# Patient Record
Sex: Female | Born: 1945 | Race: White | Hispanic: No | Marital: Married
Health system: Southern US, Community
[De-identification: ages and names within clinical notes are randomized; demographics above are authoritative.]

## PROBLEM LIST (undated history)

## (undated) DIAGNOSIS — Z8619 Personal history of other infectious and parasitic diseases: Secondary | ICD-10-CM

## (undated) DIAGNOSIS — F329 Major depressive disorder, single episode, unspecified: Secondary | ICD-10-CM

## (undated) DIAGNOSIS — J449 Chronic obstructive pulmonary disease, unspecified: Secondary | ICD-10-CM

## (undated) DIAGNOSIS — T7840XA Allergy, unspecified, initial encounter: Secondary | ICD-10-CM

## (undated) DIAGNOSIS — E039 Hypothyroidism, unspecified: Secondary | ICD-10-CM

## (undated) DIAGNOSIS — Z860100 Personal history of colon polyps, unspecified: Secondary | ICD-10-CM

## (undated) DIAGNOSIS — Z8719 Personal history of other diseases of the digestive system: Secondary | ICD-10-CM

## (undated) DIAGNOSIS — F32A Depression, unspecified: Secondary | ICD-10-CM

## (undated) DIAGNOSIS — M549 Dorsalgia, unspecified: Secondary | ICD-10-CM

## (undated) DIAGNOSIS — E669 Obesity, unspecified: Secondary | ICD-10-CM

## (undated) HISTORY — DX: Personal history of other diseases of the digestive system: Z87.19

## (undated) HISTORY — DX: Personal history of other infectious and parasitic diseases: Z86.19

## (undated) HISTORY — PX: TONSILLECTOMY: SUR1361

## (undated) HISTORY — PX: FOOT SURGERY: SHX648

## (undated) HISTORY — PX: NASAL SEPTUM SURGERY: SHX37

## (undated) HISTORY — DX: Depression, unspecified: F32.A

## (undated) HISTORY — DX: Dorsalgia, unspecified: M54.9

## (undated) HISTORY — DX: Personal history of colon polyps, unspecified: Z86.0100

## (undated) HISTORY — DX: Chronic obstructive pulmonary disease, unspecified: J44.9

## (undated) HISTORY — DX: Personal history of colonic polyps: Z86.010

## (undated) HISTORY — DX: Hypothyroidism, unspecified: E03.9

## (undated) HISTORY — DX: Major depressive disorder, single episode, unspecified: F32.9

## (undated) HISTORY — DX: Allergy, unspecified, initial encounter: T78.40XA

## (undated) HISTORY — DX: Obesity, unspecified: E66.9

## (undated) HISTORY — PX: OTHER SURGICAL HISTORY: SHX169

---

## 1983-05-21 HISTORY — PX: CARDIOVASCULAR STRESS TEST: SHX262

## 1988-05-20 HISTORY — PX: CHOLECYSTECTOMY: SHX55

## 1988-05-20 HISTORY — PX: TOTAL ABDOMINAL HYSTERECTOMY: SHX209

## 1988-05-20 HISTORY — PX: OTHER SURGICAL HISTORY: SHX169

## 2000-05-20 HISTORY — PX: BACK SURGERY: SHX140

## 2003-05-21 HISTORY — PX: GASTRIC BYPASS: SHX52

## 2003-05-21 HISTORY — PX: CT HEAD LIMITED W/CM: HXRAD128

## 2005-12-17 ENCOUNTER — Emergency Department (HOSPITAL_COMMUNITY): Admission: EM | Admit: 2005-12-17 | Discharge: 2005-12-17 | Payer: Self-pay | Admitting: Emergency Medicine

## 2006-01-07 ENCOUNTER — Ambulatory Visit: Payer: Self-pay | Admitting: Family Medicine

## 2006-02-05 ENCOUNTER — Ambulatory Visit: Payer: Self-pay | Admitting: Family Medicine

## 2006-02-18 ENCOUNTER — Encounter: Admission: RE | Admit: 2006-02-18 | Discharge: 2006-02-18 | Payer: Self-pay | Admitting: Family Medicine

## 2006-04-02 ENCOUNTER — Ambulatory Visit: Payer: Self-pay | Admitting: Family Medicine

## 2006-05-06 ENCOUNTER — Ambulatory Visit: Payer: Self-pay | Admitting: Family Medicine

## 2006-06-03 ENCOUNTER — Ambulatory Visit: Payer: Self-pay | Admitting: Family Medicine

## 2006-06-03 LAB — CONVERTED CEMR LAB
Rhuematoid fact SerPl-aCnc: <20 intl units/mL — ABNORMAL LOW (ref 0.0–20.0)
Sed Rate: 10 mm/hr (ref 0–25)

## 2006-08-18 ENCOUNTER — Encounter: Payer: Self-pay | Admitting: Family Medicine

## 2006-08-18 DIAGNOSIS — J309 Allergic rhinitis, unspecified: Secondary | ICD-10-CM | POA: Insufficient documentation

## 2006-08-18 DIAGNOSIS — F410 Panic disorder [episodic paroxysmal anxiety] without agoraphobia: Secondary | ICD-10-CM

## 2006-08-18 DIAGNOSIS — F329 Major depressive disorder, single episode, unspecified: Secondary | ICD-10-CM

## 2006-08-18 DIAGNOSIS — E039 Hypothyroidism, unspecified: Secondary | ICD-10-CM | POA: Insufficient documentation

## 2006-12-02 ENCOUNTER — Encounter: Payer: Self-pay | Admitting: *Deleted

## 2006-12-03 ENCOUNTER — Ambulatory Visit: Payer: Self-pay | Admitting: Family Medicine

## 2006-12-03 DIAGNOSIS — F411 Generalized anxiety disorder: Secondary | ICD-10-CM | POA: Insufficient documentation

## 2006-12-15 ENCOUNTER — Telehealth (INDEPENDENT_AMBULATORY_CARE_PROVIDER_SITE_OTHER): Payer: Self-pay | Admitting: *Deleted

## 2006-12-16 ENCOUNTER — Telehealth (INDEPENDENT_AMBULATORY_CARE_PROVIDER_SITE_OTHER): Payer: Self-pay | Admitting: *Deleted

## 2007-01-13 ENCOUNTER — Ambulatory Visit: Payer: Self-pay | Admitting: Internal Medicine

## 2007-02-12 ENCOUNTER — Ambulatory Visit: Payer: Self-pay | Admitting: Family Medicine

## 2007-02-16 ENCOUNTER — Telehealth (INDEPENDENT_AMBULATORY_CARE_PROVIDER_SITE_OTHER): Payer: Self-pay | Admitting: Internal Medicine

## 2007-03-09 ENCOUNTER — Telehealth (INDEPENDENT_AMBULATORY_CARE_PROVIDER_SITE_OTHER): Payer: Self-pay | Admitting: *Deleted

## 2007-03-31 ENCOUNTER — Telehealth (INDEPENDENT_AMBULATORY_CARE_PROVIDER_SITE_OTHER): Payer: Self-pay | Admitting: *Deleted

## 2007-05-29 ENCOUNTER — Telehealth: Payer: Self-pay | Admitting: Family Medicine

## 2007-06-11 ENCOUNTER — Telehealth: Payer: Self-pay | Admitting: Family Medicine

## 2007-07-02 ENCOUNTER — Ambulatory Visit: Payer: Self-pay | Admitting: Family Medicine

## 2007-07-03 ENCOUNTER — Telehealth (INDEPENDENT_AMBULATORY_CARE_PROVIDER_SITE_OTHER): Payer: Self-pay | Admitting: Internal Medicine

## 2007-07-03 LAB — CONVERTED CEMR LAB
Basophils Absolute: 0.1 10*3/uL (ref 0.0–0.1)
Eosinophils Absolute: 0.2 10*3/uL (ref 0.0–0.6)
MCHC: 33 g/dL (ref 30.0–36.0)
MCV: 94.3 fL (ref 78.0–100.0)
Monocytes Relative: 6.3 % (ref 3.0–11.0)
Platelets: 189 10*3/uL (ref 150–400)
RBC: 4.03 M/uL (ref 3.87–5.11)
RDW: 12.5 % (ref 11.5–14.6)

## 2007-07-07 ENCOUNTER — Telehealth (INDEPENDENT_AMBULATORY_CARE_PROVIDER_SITE_OTHER): Payer: Self-pay | Admitting: Internal Medicine

## 2007-07-08 ENCOUNTER — Ambulatory Visit: Payer: Self-pay | Admitting: Family Medicine

## 2007-07-14 ENCOUNTER — Telehealth (INDEPENDENT_AMBULATORY_CARE_PROVIDER_SITE_OTHER): Payer: Self-pay | Admitting: Internal Medicine

## 2007-11-16 ENCOUNTER — Ambulatory Visit: Payer: Self-pay | Admitting: Family Medicine

## 2007-11-25 ENCOUNTER — Telehealth: Payer: Self-pay | Admitting: Family Medicine

## 2007-11-26 ENCOUNTER — Ambulatory Visit: Payer: Self-pay | Admitting: Family Medicine

## 2008-01-21 ENCOUNTER — Telehealth: Payer: Self-pay | Admitting: Family Medicine

## 2008-02-03 ENCOUNTER — Ambulatory Visit: Payer: Self-pay | Admitting: Family Medicine

## 2008-02-04 ENCOUNTER — Ambulatory Visit: Payer: Self-pay | Admitting: Family Medicine

## 2008-02-05 LAB — CONVERTED CEMR LAB
Albumin: 3.7 g/dL (ref 3.5–5.2)
BUN: 20 mg/dL (ref 6–23)
Basophils Absolute: 0.1 10*3/uL (ref 0.0–0.1)
Basophils Relative: 1 % (ref 0.0–3.0)
Calcium: 9 mg/dL (ref 8.4–10.5)
Cholesterol: 252 mg/dL (ref 0–200)
Eosinophils Absolute: 0.4 10*3/uL (ref 0.0–0.7)
GFR calc Af Amer: 93 mL/min
GFR calc non Af Amer: 77 mL/min
Glucose, Bld: 92 mg/dL (ref 70–99)
HDL: 94.5 mg/dL (ref >39.0)
Lymphocytes Relative: 50.9 % — ABNORMAL HIGH (ref 12.0–46.0)
MCHC: 33.2 g/dL (ref 30.0–36.0)
MCV: 95 fL (ref 78.0–100.0)
Neutro Abs: 2.5 10*3/uL (ref 1.4–7.7)
Neutrophils Relative %: 35.6 % — ABNORMAL LOW (ref 43.0–77.0)
Potassium: 4.5 meq/L (ref 3.5–5.1)
RBC: 4.15 M/uL (ref 3.87–5.11)
RDW: 13.8 % (ref 11.5–14.6)
Sodium: 140 meq/L (ref 135–145)
TSH: 22.33 microintl units/mL — ABNORMAL HIGH (ref 0.35–5.50)
Total Protein: 6.9 g/dL (ref 6.0–8.3)
VLDL: 16 mg/dL (ref 0–40)
Vitamin B-12: 177 pg/mL — ABNORMAL LOW (ref 211–911)

## 2008-02-10 ENCOUNTER — Telehealth: Payer: Self-pay | Admitting: Family Medicine

## 2008-02-15 ENCOUNTER — Ambulatory Visit: Payer: Self-pay | Admitting: Family Medicine

## 2008-02-22 ENCOUNTER — Telehealth (INDEPENDENT_AMBULATORY_CARE_PROVIDER_SITE_OTHER): Payer: Self-pay | Admitting: *Deleted

## 2008-03-16 ENCOUNTER — Ambulatory Visit: Payer: Self-pay | Admitting: Family Medicine

## 2008-03-17 ENCOUNTER — Ambulatory Visit: Payer: Self-pay | Admitting: Family Medicine

## 2008-03-17 LAB — CONVERTED CEMR LAB
TSH: 1.1 microintl units/mL (ref 0.35–5.50)
Vitamin B-12: 349 pg/mL (ref 211–911)

## 2008-04-01 ENCOUNTER — Ambulatory Visit: Payer: Self-pay | Admitting: Family Medicine

## 2008-04-13 ENCOUNTER — Telehealth (INDEPENDENT_AMBULATORY_CARE_PROVIDER_SITE_OTHER): Payer: Self-pay | Admitting: *Deleted

## 2008-05-25 ENCOUNTER — Ambulatory Visit: Payer: Self-pay | Admitting: Family Medicine

## 2008-05-28 ENCOUNTER — Encounter: Admission: RE | Admit: 2008-05-28 | Discharge: 2008-05-28 | Payer: Self-pay | Admitting: Family Medicine

## 2008-06-01 ENCOUNTER — Ambulatory Visit: Payer: Self-pay | Admitting: Family Medicine

## 2008-06-01 LAB — CONVERTED CEMR LAB
BUN: 20 mg/dL (ref 6–23)
Calcium: 8.9 mg/dL (ref 8.4–10.5)
Creatinine, Ser: 0.7 mg/dL (ref 0.4–1.2)
GFR calc Af Amer: 109 mL/min
GFR calc non Af Amer: 90 mL/min

## 2008-06-02 ENCOUNTER — Ambulatory Visit: Payer: Self-pay | Admitting: Cardiology

## 2008-06-08 ENCOUNTER — Encounter: Payer: Self-pay | Admitting: Family Medicine

## 2008-06-23 ENCOUNTER — Ambulatory Visit (HOSPITAL_COMMUNITY): Admission: RE | Admit: 2008-06-23 | Discharge: 2008-06-23 | Payer: Self-pay | Admitting: Neurosurgery

## 2008-07-06 ENCOUNTER — Encounter: Payer: Self-pay | Admitting: Family Medicine

## 2008-07-12 ENCOUNTER — Telehealth: Payer: Self-pay | Admitting: Family Medicine

## 2008-07-12 ENCOUNTER — Encounter: Payer: Self-pay | Admitting: Family Medicine

## 2008-11-04 ENCOUNTER — Encounter: Payer: Self-pay | Admitting: Family Medicine

## 2009-02-10 ENCOUNTER — Ambulatory Visit: Payer: Self-pay | Admitting: Family Medicine

## 2009-02-10 DIAGNOSIS — D692 Other nonthrombocytopenic purpura: Secondary | ICD-10-CM | POA: Insufficient documentation

## 2009-02-13 ENCOUNTER — Telehealth: Payer: Self-pay | Admitting: Family Medicine

## 2009-02-13 ENCOUNTER — Ambulatory Visit: Payer: Self-pay | Admitting: Family Medicine

## 2009-02-17 LAB — CONVERTED CEMR LAB
ALT: 17 units/L (ref 0–35)
BUN: 16 mg/dL (ref 6–23)
CO2: 31 meq/L (ref 19–32)
Chloride: 105 meq/L (ref 96–112)
Cholesterol: 224 mg/dL — ABNORMAL HIGH (ref 0–200)
Direct LDL: 147.4 mg/dL
Eosinophils Absolute: 0.2 10*3/uL (ref 0.0–0.7)
Eosinophils Relative: 2.5 % (ref 0.0–5.0)
Glucose, Bld: 94 mg/dL (ref 70–99)
HCT: 41.4 % (ref 36.0–46.0)
Lymphs Abs: 3.4 10*3/uL (ref 0.7–4.0)
MCHC: 33.3 g/dL (ref 30.0–36.0)
MCV: 97.2 fL (ref 78.0–100.0)
Monocytes Absolute: 0.5 10*3/uL (ref 0.1–1.0)
Platelets: 202 10*3/uL (ref 150.0–400.0)
Potassium: 4.6 meq/L (ref 3.5–5.1)
Prothrombin Time: 9.2 s (ref 9.1–11.7)
TSH: 7.84 microintl units/mL — ABNORMAL HIGH (ref 0.35–5.50)
Total Bilirubin: 1 mg/dL (ref 0.3–1.2)
Total Protein: 6.6 g/dL (ref 6.0–8.3)
WBC: 7.3 10*3/uL (ref 4.5–10.5)

## 2009-03-22 ENCOUNTER — Ambulatory Visit: Payer: Self-pay | Admitting: Family Medicine

## 2009-05-02 ENCOUNTER — Ambulatory Visit: Payer: Self-pay | Admitting: Family Medicine

## 2009-05-02 DIAGNOSIS — E7800 Pure hypercholesterolemia, unspecified: Secondary | ICD-10-CM

## 2009-05-02 DIAGNOSIS — M949 Disorder of cartilage, unspecified: Secondary | ICD-10-CM

## 2009-05-02 DIAGNOSIS — M899 Disorder of bone, unspecified: Secondary | ICD-10-CM | POA: Insufficient documentation

## 2009-05-02 LAB — HM SIGMOIDOSCOPY

## 2009-05-24 ENCOUNTER — Encounter: Admission: RE | Admit: 2009-05-24 | Discharge: 2009-05-24 | Payer: Self-pay | Admitting: Family Medicine

## 2009-05-29 ENCOUNTER — Encounter: Admission: RE | Admit: 2009-05-29 | Discharge: 2009-05-29 | Payer: Self-pay | Admitting: Family Medicine

## 2009-05-29 ENCOUNTER — Encounter (INDEPENDENT_AMBULATORY_CARE_PROVIDER_SITE_OTHER): Payer: Self-pay | Admitting: *Deleted

## 2009-05-31 ENCOUNTER — Encounter (INDEPENDENT_AMBULATORY_CARE_PROVIDER_SITE_OTHER): Payer: Self-pay | Admitting: *Deleted

## 2009-06-14 ENCOUNTER — Ambulatory Visit: Payer: Self-pay | Admitting: Family Medicine

## 2009-06-21 LAB — CONVERTED CEMR LAB
Alkaline Phosphatase: 85 units/L (ref 39–117)
Basophils Relative: 0.5 % (ref 0.0–3.0)
Bilirubin, Direct: 0.1 mg/dL (ref 0.0–0.3)
Calcium: 8.8 mg/dL (ref 8.4–10.5)
Eosinophils Absolute: 0.3 10*3/uL (ref 0.0–0.7)
GFR calc non Af Amer: 89.41 mL/min (ref >60)
HDL: 70.1 mg/dL (ref >39.00)
Hemoglobin: 12.8 g/dL (ref 12.0–15.0)
MCHC: 33 g/dL (ref 30.0–36.0)
MCV: 93.6 fL (ref 78.0–100.0)
Monocytes Absolute: 0.5 10*3/uL (ref 0.1–1.0)
Neutro Abs: 2.4 10*3/uL (ref 1.4–7.7)
RBC: 4.15 M/uL (ref 3.87–5.11)
Sodium: 142 meq/L (ref 135–145)
Total CHOL/HDL Ratio: 3
VLDL: 18.2 mg/dL (ref 0.0–40.0)

## 2009-06-22 ENCOUNTER — Ambulatory Visit: Payer: Self-pay | Admitting: Family Medicine

## 2009-06-30 ENCOUNTER — Ambulatory Visit: Payer: Self-pay | Admitting: Internal Medicine

## 2009-08-10 ENCOUNTER — Ambulatory Visit: Payer: Self-pay | Admitting: Family Medicine

## 2009-12-05 ENCOUNTER — Ambulatory Visit: Payer: Self-pay | Admitting: Family Medicine

## 2009-12-12 ENCOUNTER — Emergency Department (HOSPITAL_COMMUNITY): Admission: EM | Admit: 2009-12-12 | Discharge: 2009-12-13 | Payer: Self-pay | Admitting: Emergency Medicine

## 2009-12-14 ENCOUNTER — Encounter: Payer: Self-pay | Admitting: Family Medicine

## 2010-01-09 ENCOUNTER — Telehealth: Payer: Self-pay | Admitting: Family Medicine

## 2010-01-10 ENCOUNTER — Ambulatory Visit: Payer: Self-pay | Admitting: Family Medicine

## 2010-01-23 ENCOUNTER — Telehealth (INDEPENDENT_AMBULATORY_CARE_PROVIDER_SITE_OTHER): Payer: Self-pay | Admitting: *Deleted

## 2010-01-25 ENCOUNTER — Ambulatory Visit: Payer: Self-pay | Admitting: Family Medicine

## 2010-01-25 DIAGNOSIS — I10 Essential (primary) hypertension: Secondary | ICD-10-CM | POA: Insufficient documentation

## 2010-01-31 ENCOUNTER — Encounter (INDEPENDENT_AMBULATORY_CARE_PROVIDER_SITE_OTHER): Payer: Self-pay | Admitting: *Deleted

## 2010-01-31 ENCOUNTER — Ambulatory Visit: Payer: Self-pay | Admitting: Family Medicine

## 2010-01-31 LAB — CONVERTED CEMR LAB
AST: 20 units/L (ref 0–37)
Albumin: 3.7 g/dL (ref 3.5–5.2)
BUN: 22 mg/dL (ref 6–23)
Cholesterol: 188 mg/dL (ref 0–200)
GFR calc non Af Amer: 82.41 mL/min (ref >60)
Glucose, Bld: 94 mg/dL (ref 70–99)
Potassium: 4.4 meq/L (ref 3.5–5.1)
VLDL: 17.6 mg/dL (ref 0.0–40.0)

## 2010-02-16 ENCOUNTER — Ambulatory Visit: Payer: Self-pay | Admitting: Family Medicine

## 2010-03-02 ENCOUNTER — Ambulatory Visit: Payer: Self-pay | Admitting: Family Medicine

## 2010-03-15 ENCOUNTER — Ambulatory Visit: Payer: Self-pay | Admitting: Family Medicine

## 2010-04-03 ENCOUNTER — Ambulatory Visit: Payer: Self-pay | Admitting: Family Medicine

## 2010-04-24 ENCOUNTER — Ambulatory Visit: Payer: Self-pay | Admitting: Family Medicine

## 2010-05-23 ENCOUNTER — Telehealth: Payer: Self-pay | Admitting: Family Medicine

## 2010-05-23 ENCOUNTER — Ambulatory Visit
Admission: RE | Admit: 2010-05-23 | Discharge: 2010-05-23 | Payer: Self-pay | Source: Home / Self Care | Attending: Family Medicine | Admitting: Family Medicine

## 2010-05-28 ENCOUNTER — Telehealth (INDEPENDENT_AMBULATORY_CARE_PROVIDER_SITE_OTHER): Payer: Self-pay | Admitting: *Deleted

## 2010-05-29 ENCOUNTER — Ambulatory Visit: Admit: 2010-05-29 | Payer: Self-pay | Admitting: Family Medicine

## 2010-06-05 ENCOUNTER — Ambulatory Visit
Admission: RE | Admit: 2010-06-05 | Discharge: 2010-06-05 | Payer: Self-pay | Source: Home / Self Care | Attending: Family Medicine | Admitting: Family Medicine

## 2010-06-05 ENCOUNTER — Encounter: Payer: Self-pay | Admitting: Family Medicine

## 2010-06-05 DIAGNOSIS — R413 Other amnesia: Secondary | ICD-10-CM | POA: Insufficient documentation

## 2010-06-05 LAB — CONVERTED CEMR LAB
Cholesterol, target level: 200 mg/dL
HDL goal, serum: 40 mg/dL
LDL Goal: 160 mg/dL

## 2010-06-12 ENCOUNTER — Encounter
Admission: RE | Admit: 2010-06-12 | Discharge: 2010-06-12 | Payer: Self-pay | Source: Home / Self Care | Attending: Family Medicine | Admitting: Family Medicine

## 2010-06-12 LAB — HM MAMMOGRAPHY: HM Mammogram: NEGATIVE

## 2010-06-19 ENCOUNTER — Ambulatory Visit
Admission: RE | Admit: 2010-06-19 | Discharge: 2010-06-19 | Payer: Self-pay | Source: Home / Self Care | Attending: Family Medicine | Admitting: Family Medicine

## 2010-06-19 NOTE — Assessment & Plan Note (Signed)
Summary: URI   Vital Signs:  Patient profile:   65 year old female Weight:      208 pounds Temp:     98.6 degrees F oral Pulse rate:   80 / minute Pulse rhythm:   regular Resp:     16 per minute BP sitting:   112 / 60  (left arm) Cuff size:   large  Vitals Entered By: Mervin Hack CMA Duncan Dull) (June 30, 2009 4:06 PM) CC: cold   History of Present Illness: Started with sore throat yesterday now with ear pain, facial pain starting to move into her bronchial tubes (with her asthma)  Lots of cough with yellow mucus--thinks this is from PND at this point White nasal discharge  no obvious fever taking tylenol Some SOB--no wheezing.  Had to sleep in chair last night  Pain just in right ear frontal headache and maxillary  Allergies: 1)  ! Keflex 2)  ! * Shellfish  Past History:  Past medical, surgical, family and social histories (including risk factors) reviewed for relevance to current acute and chronic problems.  Past Medical History: Reviewed history from 08/18/2006 and no changes required. Allergic rhinitis Asthma, MILD INTERMITTENT Depression Hypothyroidism  Past Surgical History: Reviewed history from 08/18/2006 and no changes required. 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 1990 AND 2005 STRESS TEST - NML 1985  RECOMMENDED BY ID DOC - CHRONIC C.DIFF. (ANY ABX PRESCRIBED, ALSO GIVE VANCOMYCIN)  Family History: Reviewed history from 08/18/2006 and no changes required. Father: ALIVE AT 24, SMOKER, SCHIZOPHRENIC Mother: DEAD 53 PANCREATIC CA, ETOH Siblings: 1 SISTER, HEALTHY BREAST CA: PATERNAL AUNT uncle: ETOH,  STOMACH CA AUNT: BRAIN TUMOR  Social History: Reviewed history from 08/18/2006 and no changes  required. Marital Status: Married X 40 YEARS Children: 2 DAUGHTERS, 1 SON WITH ANXIETY Occupation: RETIRED [employer], [employer] / MINISTRY  Review of Systems       no nausea or vomiting Appetite okay  Physical Exam  General:  alert.  NAD Head:  mild maxillary tenderness Ears:  R ear normal and L ear normal.   Nose:  moderate inflammation Mouth:  no erythema and no exudates.   Neck:  supple, no masses, and no cervical lymphadenopathy.   Lungs:  normal respiratory effort, no intercostal retractions, no accessory muscle use, normal breath sounds, and no wheezes.     Impression & Recommendations:  Problem # 1:  SINUSITIS - ACUTE-NOS (ICD-461.9) Assessment New already with purulent mucus given asthma history, will treat with antibiotic (she has done well with z-pak)  Problem # 2:  ASTHMA, MILD INTERMITTENT (ICD-493.90) Assessment: Comment Only not exacerbated will give prednisone Rx in case she gets more SOB/wheezing  Her updated medication list for this problem includes:    Proventil Hfa 108 (90 Base) Mcg/act Aers (Albuterol sulfate) .Marland Kitchen... As directed as needed    Advair Diskus 250-50 Mcg/dose Misc (Fluticasone-salmeterol) .Marland Kitchen... 1 puff two times a day as needed    Prednisone 20 Mg Tabs (Prednisone) .Marland Kitchen... 2 tabs daily for 5 days, then 1 tab daily for 5 days for asthma flare  Complete Medication List: 1)  Estradiol 1 Mg Tabs (Estradiol) .... Take 1 tablet by mouth once a day 2)  Synthroid 125 Mcg Tabs (Levothyroxine sodium) .Marland Kitchen.. 1 tab by mouth daily  3)  Ativan 1 Mg Tabs (Lorazepam) .... As needed 4)  Calcium 500/d 500-400 Mg-unit Chew (Calcium-vitamin d) .... Three times a day 5)  Bl Magnesium 250 Mg Tabs (Magnesium) .... Three times a day 6)  Bl Multiple Vitamins Tabs (Multiple vitamins-minerals) .... Take 1 tablet by mouth once a day 7)  Prozac 40 Mg Caps (Fluoxetine hcl) .Marland Kitchen.. 1 tab by mouth daily 8)  Proventil Hfa 108 (90 Base) Mcg/act Aers (Albuterol sulfate) ....  As directed as needed 9)  Advair Diskus 250-50 Mcg/dose Misc (Fluticasone-salmeterol) .Marland Kitchen.. 1 puff two times a day as needed 10)  Carafate 1 Gm/35ml Susp (Sucralfate) .... 2 teaspoonfuls  1 hr prior to meals and at bedtime  as needed 11)  Claritin-d 24 Hour 10-240 Mg Tb24 (Loratadine-pseudoephedrine) .... Take 1 tablet by mouth once a day as needed 12)  [facility]vs Vitamin B12 1000 Mcg Tabs (Cyanocobalamin) .... Take 1 tablet by mouth once a day 13)  Simvastatin 20 Mg Tabs (Simvastatin) .... Take 1 tablet by mouth once a day 14)  B Complex Tabs (B complex vitamins) .... Take 1 by mouth once daily 15)  Azithromycin 250 Mg Tabs (Azithromycin) .... 2 tabs today, then 1 tab daily for 4 days for sinus infection 16)  Prednisone 20 Mg Tabs (Prednisone) .... 2 tabs daily for 5 days, then 1 tab daily for 5 days for asthma flare  Patient Instructions: 1)  Please schedule a follow-up appointment as needed .  Prescriptions: PREDNISONE 20 MG TABS (PREDNISONE) 2 tabs daily for 5 days, then 1 tab daily for 5 days for asthma flare  #15 x 0   Entered and Authorized by:   Cindee Salt MD   Signed by:   Cindee Salt MD on 06/30/2009   Method used:   Print then Give to Patient   RxID:   [id number] AZITHROMYCIN 250 MG TABS (AZITHROMYCIN) 2 tabs today, then 1 tab daily for 4 days for sinus infection  #6 x 0   Entered and Authorized by:   Cindee Salt MD   Signed by:   Cindee Salt MD on 06/30/2009   Method used:   Electronically to        [facility]  [address] (retail)       [address]       [place], [place]  [address]       Ph: [phone] or [phone]       Fax: [phone]   RxID:   [id number]   Current Allergies (reviewed today): ! KEFLEX ! * SHELLFISH

## 2010-06-19 NOTE — Assessment & Plan Note (Signed)
Summary: Roberta Mooney b12/rbh  Nurse Visit   Allergies: 1)  ! Keflex 2)  ! * Shellfish  Medication Administration  Injection # 1:    Medication: Vit B12 1000 mcg    Diagnosis: OTHER MALAISE AND FATIGUE (ICD-780.79)    Route: IM    Site: R deltoid    Exp Date: 01/19/2011    Lot #: [id number]    Mfr: American Regent    Patient tolerated injection without complications    Given by: Linde Gillis CMA Duncan Dull) (August 10, 2009 10:17 AM)  Orders Added: 1)  Vit B12 1000 mcg [J3420] 2)  Admin of Therapeutic Inj  intramuscular or subcutaneous [[id number]]  Current Allergies: ! KEFLEX ! * SHELLFISH

## 2010-06-19 NOTE — Assessment & Plan Note (Signed)
Summary: B-12 INJ/BEDSOLE/CLE  Nurse Visit   Allergies: 1)  ! Keflex 2)  ! * Shellfish  Medication Administration  Injection # 1:    Medication: Vit B12 1000 mcg    Diagnosis: OTHER MALAISE AND FATIGUE (ICD-780.79)    Route: IM    Site: L deltoid    Exp Date: 10/19/2011    Lot #: [id number]    Mfr: American Regent    Patient tolerated injection without complications    Given by: Sydell Axon LPN (April 03, 2010 10:16 AM)  Orders Added: 1)  Vit B12 1000 mcg [J3420] 2)  Admin of Therapeutic Inj  intramuscular or subcutaneous [[id number]]   Medication Administration  Injection # 1:    Medication: Vit B12 1000 mcg    Diagnosis: OTHER MALAISE AND FATIGUE (ICD-780.79)    Route: IM    Site: L deltoid    Exp Date: 10/19/2011    Lot #: [id number]    Mfr: American Regent    Patient tolerated injection without complications    Given by: Sydell Axon LPN (April 03, 2010 10:16 AM)  Orders Added: 1)  Vit B12 1000 mcg [J3420] 2)  Admin of Therapeutic Inj  intramuscular or subcutaneous [[id number]]

## 2010-06-19 NOTE — Letter (Signed)
Summary: Results Follow up Letter  [name]  [address] [place], [place] [address]   Phone: [phone]  Fax: [phone]    05/29/2009 MRN: [id number]     [facility] [address] [place], [place]  [address]    Dear Ms. Bright,  The following are the results of your recent test(s):  Test         Result    Pap Smear:        Normal _____  Not Normal _____ Comments: ______________________________________________________ Cholesterol: LDL(Bad cholesterol):         Your goal is less than:         HDL (Good cholesterol):       Your goal is more than: Comments:  ______________________________________________________ Mammogram:        Normal __x___  Not Normal _____ Comments:Repeat in 1 year  ___________________________________________________________________ Hemoccult:        Normal _____  Not normal _______ Comments:    _____________________________________________________________________ Other Tests:    We routinely do not discuss normal results over the telephone.  If you desire a copy of the results, or you have any questions about this information we can discuss them at your next office visit.   Sincerely,  Kerby Nora MD

## 2010-06-19 NOTE — Letter (Signed)
Summary: Results Follow up Letter  [facility]  [address] [place], [place] [address]   Phone: [phone]  Fax: [phone]    05/31/2009 MRN: [id number]     [facility] [address] [place], [place]  [address]    Dear Ms. Feliz,  The following are the results of your recent test(s):  Test         Result    Pap Smear:        Normal _____  Not Normal _____ Comments: ______________________________________________________ Cholesterol: LDL(Bad cholesterol):         Your goal is less than:         HDL (Good cholesterol):       Your goal is more than: Comments:  ______________________________________________________ Mammogram:        Normal _____  Not Normal _____ Comments:  ___________________________________________________________________ Hemoccult:        Normal _____  Not normal _______ Comments:    _____________________________________________________________________ Other Tests:Bone Density :Notify Pt she has mild osteopenia of hip, spine is normal. Recommend weight bearing exercise (walking) and Calciuma and vit D as she is already taking.  Recheck in 2 years. Kerby Nora MD  May 29, 2009 4:00 PM    We routinely do not discuss normal results over the telephone.  If you desire a copy of the results, or you have any questions about this information we can discuss them at your next office visit.   Sincerely,  Kerby Nora MD

## 2010-06-19 NOTE — Assessment & Plan Note (Signed)
Summary: B-12/BEDSOLE/JRR  Nurse Visit   Allergies: 1)  ! Keflex 2)  ! * Shellfish  Medication Administration  Injection # 1:    Medication: Vit B12 1000 mcg    Diagnosis: OTHER MALAISE AND FATIGUE (ICD-780.79)    Route: IM    Site: L deltoid    Exp Date: 04/20/2011    Lot #: [id number]    Mfr: American Regent    Patient tolerated injection without complications    Given by: Sydell Axon LPN (January 31, 2010 10:04 AM)  Orders Added: 1)  Vit B12 1000 mcg [J3420] 2)  Admin of Therapeutic Inj  intramuscular or subcutaneous [[id number]]   Medication Administration  Injection # 1:    Medication: Vit B12 1000 mcg    Diagnosis: OTHER MALAISE AND FATIGUE (ICD-780.79)    Route: IM    Site: L deltoid    Exp Date: 04/20/2011    Lot #: [id number]    Mfr: American Regent    Patient tolerated injection without complications    Given by: Sydell Axon LPN (January 31, 2010 10:04 AM)  Orders Added: 1)  Vit B12 1000 mcg [J3420] 2)  Admin of Therapeutic Inj  intramuscular or subcutaneous [[id number]]

## 2010-06-19 NOTE — Progress Notes (Signed)
Summary: regarding B12  Phone Note Call from Patient Call back at Home Phone [phone]   Caller: Patient Call For: Kerby Nora MD Summary of Call: Pt has been getting monthly B12 injections for about 3 months.  She doesnt thinks the dose is high enough, says by the end of the month she starts having memory issues.  Asks if she should get these more frequently. Initial call taken by: Lowella Petties CMA,  January 09, 2010 9:41 AM  Follow-up for Phone Call        I am going to let Dr. Ermalene Searing address this.  b12 levels would indicate if adequate. Follow-up by: Hannah Beat MD,  January 09, 2010 12:49 PM  Additional Follow-up for Phone Call Additional follow up Details #1::        Have her come in for B12 level 2 weeks after B12 injection..  If low...will increase frequency to every 2 weeks.  Additional Follow-up by: Kerby Nora MD,  January 11, 2010 12:40 AM    Additional Follow-up for Phone Call Additional follow up Details #2::    Patient advised via message on voice mail to call and schedule lab appt in two weeks since b-12 on tuesday Follow-up by: Benny Lennert CMA Duncan Dull),  January 11, 2010 8:05 AM

## 2010-06-19 NOTE — Assessment & Plan Note (Signed)
Summary: BEDSOLE/B12/RBH  Nurse Visit   Allergies: 1)  ! Keflex 2)  ! * Shellfish  Medication Administration  Injection # 1:    Medication: Vit B12 1000 mcg    Diagnosis: OTHER MALAISE AND FATIGUE (ICD-780.79)    Route: IM    Site: R deltoid    Exp Date: 08/19/2011    Lot #: [id number]    Mfr: American Regent    Patient tolerated injection without complications    Given by: Linde Gillis CMA Duncan Dull) (February 16, 2010 2:58 PM)  Orders Added: 1)  Vit B12 1000 mcg [J3420] 2)  Admin of Therapeutic Inj  intramuscular or subcutaneous [[id number]]

## 2010-06-19 NOTE — Assessment & Plan Note (Signed)
Summary: B-12 INJ/BEDSOLE/CLE  Nurse Visit   Allergies: 1)  ! Keflex 2)  ! * Shellfish  Medication Administration  Injection # 1:    Medication: Vit B12 1000 mcg    Diagnosis: OTHER MALAISE AND FATIGUE (ICD-780.79)    Route: IM    Site: R deltoid    Exp Date: 11/18/2011    Lot #: [id number]    Mfr: American Regent    Patient tolerated injection without complications    Given by: Lewanda Rife LPN (March 15, 2010 9:03 AM)  Orders Added: 1)  Vit B12 1000 mcg [J3420] 2)  Admin of Therapeutic Inj  intramuscular or subcutaneous [[id number]]

## 2010-06-19 NOTE — Miscellaneous (Signed)
  Clinical Lists Changes  Medications: Added new medication of * B-12 INJECTION every two weeks prn     Prior Medications: ESTRADIOL 1 MG TABS (ESTRADIOL) Take 1 tablet by mouth once a day SYNTHROID 125 MCG TABS (LEVOTHYROXINE SODIUM) 1 tab by mouth daily ATIVAN 1 MG  TABS (LORAZEPAM) as needed CALCIUM 500/D 500-400 MG-UNIT  CHEW (CALCIUM-VITAMIN D) three times a day BL MAGNESIUM 250 MG  TABS (MAGNESIUM) three times a day BL MULTIPLE VITAMINS   TABS (MULTIPLE VITAMINS-MINERALS) Take 1 tablet by mouth once a day PROZAC 40 MG CAPS (FLUOXETINE HCL) 1 tab by mouth daily PROVENTIL HFA 108 (90 BASE) MCG/ACT  AERS (ALBUTEROL SULFATE) as directed as needed ADVAIR DISKUS 250-50 MCG/DOSE  MISC (FLUTICASONE-SALMETEROL) 1 puff two times a day as needed CARAFATE 1 GM/10ML  SUSP (SUCRALFATE) 2 teaspoonfuls  1 hr prior to meals and at bedtime  as needed CLARITIN-D 24 HOUR 10-240 MG  TB24 (LORATADINE-PSEUDOEPHEDRINE) Take 1 tablet by mouth once a day as needed [facility] VITAMIN B12 1000 MCG TABS (CYANOCOBALAMIN) Take 1 tablet by mouth once a day SIMVASTATIN 20 MG TABS (SIMVASTATIN) Take 1 tablet by mouth once a day B COMPLEX  TABS (B COMPLEX VITAMINS) take 1 by mouth once daily AZITHROMYCIN 250 MG TABS (AZITHROMYCIN) 2 tabs today, then 1 tab daily for 4 days for sinus infection PREDNISONE 20 MG TABS (PREDNISONE) 2 tabs daily for 5 days, then 1 tab daily for 5 days for asthma flare Current Allergies: ! KEFLEX ! * SHELLFISH

## 2010-06-19 NOTE — Progress Notes (Signed)
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 This pt is scheduled for cpx labs next week, which labs to draw and dx codes to use? Thanks Tasha ------------------------------

## 2010-06-19 NOTE — Assessment & Plan Note (Signed)
Summary: B12 SHOT/DLO  Nurse Visit   Allergies: 1)  ! Keflex 2)  ! * Shellfish  Medication Administration  Injection # 1:    Medication: Vit B12 1000 mcg    Diagnosis: OTHER MALAISE AND FATIGUE (ICD-780.79)    Route: IM    Site: L deltoid    Exp Date: 05/012012    Lot #: [id number]    Mfr: American Regent    Patient tolerated injection without complications    Given by: Mervin Hack CMA (AAMA) (June 22, 2009 10:30 AM)  Orders Added: 1)  Vit B12 1000 mcg [J3420] 2)  Admin of Therapeutic Inj  intramuscular or subcutaneous [[id number]]   Medication Administration  Injection # 1:    Medication: Vit B12 1000 mcg    Diagnosis: OTHER MALAISE AND FATIGUE (ICD-780.79)    Route: IM    Site: L deltoid    Exp Date: 05/012012    Lot #: [id number]    Mfr: American Regent    Patient tolerated injection without complications    Given by: Mervin Hack CMA (AAMA) (June 22, 2009 10:30 AM)  Orders Added: 1)  Vit B12 1000 mcg [J3420] 2)  Admin of Therapeutic Inj  intramuscular or subcutaneous [[id number]]

## 2010-06-19 NOTE — Letter (Signed)
Summary: [facility]  [facility]   Imported By: Lanelle Bal 12/20/2009 10:40:10  _____________________________________________________________________  External Attachment:    Type:   Image     Comment:   External Document

## 2010-06-19 NOTE — Assessment & Plan Note (Signed)
Summary: B-12 INJ/BEDSOLE/CLE  Nurse Visit   Allergies: 1)  ! Keflex 2)  ! * Shellfish  Medication Administration  Injection # 1:    Medication: Vit B12 1000 mcg    Diagnosis: OTHER MALAISE AND FATIGUE (ICD-780.79)    Route: IM    Site: R deltoid    Exp Date: 11/18/2011    Lot #: [id number]    Mfr: American Regent    Patient tolerated injection without complications    Given by: Linde Gillis CMA Duncan Dull) (April 24, 2010 2:47 PM)  Orders Added: 1)  Vit B12 1000 mcg [J3420] 2)  Admin of Therapeutic Inj  intramuscular or subcutaneous [[id number]]

## 2010-06-19 NOTE — Assessment & Plan Note (Signed)
Summary: B-12 INJ/Roberta Mooney/CLE  Nurse Visit   Allergies: 1)  ! Keflex 2)  ! * Shellfish  Medication Administration  Injection # 1:    Medication: Vit B12 1000 mcg    Diagnosis: OTHER MALAISE AND FATIGUE (ICD-780.79)    Route: IM    Site: L deltoid    Exp Date: 05/09/2011    Lot #: [id number]    Mfr: American Regent    Patient tolerated injection without complications    Given by: Lowella Petties CMA (December 05, 2009 3:22 PM)  Orders Added: 1)  Admin of Therapeutic Inj  intramuscular or subcutaneous [[id number]] 2)  Vit B12 1000 mcg [J3420]  Prior Medications: ESTRADIOL 1 MG TABS (ESTRADIOL) Take 1 tablet by mouth once a day SYNTHROID 125 MCG TABS (LEVOTHYROXINE SODIUM) 1 tab by mouth daily ATIVAN 1 MG  TABS (LORAZEPAM) as needed CALCIUM 500/D 500-400 MG-UNIT  CHEW (CALCIUM-VITAMIN D) three times a day BL MAGNESIUM 250 MG  TABS (MAGNESIUM) three times a day BL MULTIPLE VITAMINS   TABS (MULTIPLE VITAMINS-MINERALS) Take 1 tablet by mouth once a day PROZAC 40 MG CAPS (FLUOXETINE HCL) 1 tab by mouth daily PROVENTIL HFA 108 (90 BASE) MCG/ACT  AERS (ALBUTEROL SULFATE) as directed as needed ADVAIR DISKUS 250-50 MCG/DOSE  MISC (FLUTICASONE-SALMETEROL) 1 puff two times a day as needed CARAFATE 1 GM/10ML  SUSP (SUCRALFATE) 2 teaspoonfuls  1 hr prior to meals and at bedtime  as needed CLARITIN-D 24 HOUR 10-240 MG  TB24 (LORATADINE-PSEUDOEPHEDRINE) Take 1 tablet by mouth once a day as needed [facility] VITAMIN B12 1000 MCG TABS (CYANOCOBALAMIN) Take 1 tablet by mouth once a day SIMVASTATIN 20 MG TABS (SIMVASTATIN) Take 1 tablet by mouth once a day B COMPLEX  TABS (B COMPLEX VITAMINS) take 1 by mouth once daily AZITHROMYCIN 250 MG TABS (AZITHROMYCIN) 2 tabs today, then 1 tab daily for 4 days for sinus infection PREDNISONE 20 MG TABS (PREDNISONE) 2 tabs daily for 5 days, then 1 tab daily for 5 days for asthma flare Current Allergies: ! KEFLEX ! * SHELLFISH   Medication Administration  Injection # 1:  Medication: Vit B12 1000 mcg    Diagnosis: OTHER MALAISE AND FATIGUE (ICD-780.79)    Route: IM    Site: L deltoid    Exp Date: 05/09/2011    Lot #: [id number]    Mfr: American Regent    Patient tolerated injection without complications    Given by: Lowella Petties CMA (December 05, 2009 3:22 PM)  Orders Added: 1)  Admin of Therapeutic Inj  intramuscular or subcutaneous [[id number]] 2)  Vit B12 1000 mcg [J3420]

## 2010-06-19 NOTE — Assessment & Plan Note (Signed)
Summary: B-12 INJ/Purity Irmen/CLE  Nurse Visit   Allergies: 1)  ! Keflex 2)  ! * Shellfish  Medication Administration  Injection # 1:    Medication: Vit B12 1000 mcg    Diagnosis: OTHER MALAISE AND FATIGUE (ICD-780.79)    Route: IM    Site: L deltoid    Exp Date: 04/21/2011    Lot #: [id number]    Mfr: American Regent    Patient tolerated injection without complications    Given by: Benny Lennert CMA (AAMA) (January 10, 2010 10:13 AM)  Orders Added: 1)  Vit B12 1000 mcg [J3420] 2)  Admin of Therapeutic Inj  intramuscular or subcutaneous [[id number]]

## 2010-06-19 NOTE — Assessment & Plan Note (Signed)
Summary: B-12 INJ/BEDSOLE/CLE  Nurse Visit   Allergies: 1)  ! Keflex 2)  ! * Shellfish  Medication Administration  Injection # 1:    Medication: Vit B12 1000 mcg    Diagnosis: OTHER MALAISE AND FATIGUE (ICD-780.79)    Route: IM    Site: L deltoid    Exp Date: 08/19/2011    Lot #: [id number]    Mfr: Abraxis    Patient tolerated injection without complications    Given by: Linde Gillis CMA Duncan Dull) (March 02, 2010 9:27 AM)  Orders Added: 1)  Vit B12 1000 mcg [J3420] 2)  Admin of Therapeutic Inj  intramuscular or subcutaneous [[id number]]

## 2010-06-19 NOTE — Assessment & Plan Note (Signed)
Summary: Hip pain   Vital Signs:  Patient Profile:   65 Years Old Female Height:     64 inches (162.56 cm) Weight:      200.50 pounds (91.14 kg) Temp:     97.8 degrees F (36.56 degrees C) oral Pulse rate:   56 / minute Pulse rhythm:   regular BP sitting:   110 / 70  (left arm) Cuff size:   large  Vitals Entered By: Silas Sacramento (February 15, 2008 8:46 AM)                 Chief Complaint:  Hip pain.  History of Present Illness: 65 yo female presents with hip pain. right-sided, it has been ongoing for one month. She doesn't describe any specific injury, or fall. She has no history of surgery or fracture on the right hip. She does have a history of having trochanteric bursitis in the past, which was relieved by a corticosteroid injection. She does have a known leg length discrepancy. She has tried multiple other modalities listed below. She recalls no other inciting event.  R hip.   trochanteric bursal. started about 1 month. hot tubs, ice packs, can't take NSAIDs.secondary to complications status post gastric bypass surgery.  No h/o surgery or fracture of the R hip.       2. LL discrepancy.  87 cm R 86 L     Current Allergies: ! KEFLEX ! * SHELLFISH  Past Medical History:    Reviewed history from 08/18/2006 and no changes required:       Allergic rhinitis       Asthma, MILD INTERMITTENT       Depression       Hypothyroidism  Past Surgical History:    Reviewed history from 08/18/2006 and no changes required:       GASTRIC BYPASS - LOST 120 LBS. 2005       C.DIFFICILE - HOSP. 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 1990 AND 2005       STRESS TEST - NML 1985              RECOMMENDED BY ID DOC - CHRONIC C.DIFF. (ANY ABX PRESCRIBED, ALSO GIVE  VANCOMYCIN)     Review of Systems  General      Denies chills, fever, and sweats.  MS      Complains of joint pain.      Denies stiffness.      she denies groin pain, or hip pain. Does have some occasional low back pain.   Physical Exam  Gen: Well-developed,well-nourished,in no acute distress; alert,appropriate and cooperative throughout examination  HEENT: Normocephalic and atraumatic without obvious abnormalities.  Ears, externally no deformities  Pulm: Breathing comfortably in no respiratory distress  Range of motion at  the waist: Flexion: mild pain Extension: mild pain Rotation: mild pain  No echymosis No edema  Rises to examination table with mild difficulty  Gait: minimally antalgic  Inspection/Deformity: N Paraspinus T: Y  B Ankle Dorsiflexion (L5,4): 5/5 B Great Toe Dorsiflexion (L5,4): 5/5 Heel Walk (  L5): WNL Toe Walk (S1): WNL Rise/Squat (L4): WNL, mild pain  SENSORY B Medial Foot (L4): WNL B Dorsum (L5): WNL B Lateral (S1): WNL Light Touch: WNL Pinprick: WNL  REFLEXES Knee (L4): 2+ Ankle (S1): 2+  B SLR, seated: neg B SLR, supine: neg B FABER: neg B Reverse FABER: neg B Greater Troch: quite tender on the R side B Log Roll: neg B Sciatic Notch: NT Leg Lengths: 1 - 1 1/2 cm leg length shorter on the L side     Impression & Recommendations:  Problem # 1:  TROCHANTERIC BURSITIS, RIGHT (ICD-726.5) Assessment: New Weak hip flexors and hip abductors hip flexion exercises, 3 x 30 daily hip abduction, 3 x 30, daily   Trochanteric Bursitis Injection, Right Verbal consent obtained. Risks, benefits, and alternatives reviewed.R greater trochanter sterilely prepped with Betadine. Ethyl Chloride used for anesthesia. 8 cc of Marcaine 0.5% injected with 2 cc of 40 mg Kenalog into trochanteric bursa at area of maximal tenderness at greater trochanter. Needle fanned for injection at several sites. Palpated afterwards and bursa massaged. No  bleeding and no complications. Decreased pain after injection. Needle: 22 gauge spinal needle  Orders: Joint Aspirate / Injection, Large (20610) Kenalog 10 mg inj (J3301)   Problem # 2:  UNEQUAL LEG LENGTH (ICD-736.81) Assessment: New L approx 1 cm shorter than the R.  Placed a correction in patient's L shoe. Ground down to customize.  Attached to [employer] OTC orthotic.    Complete Medication List: 1)  Menest 1.25 Mg Tabs (Esterified estrogens) .... Take 1 tablet by mouth once a day 2)  Synthroid 112 Mcg Tabs (Levothyroxine sodium) .Marland Kitchen.. 1 tab by mouth daily 3)  Singulair 10 Mg Tabs (Montelukast sodium) .... Take 1 tab by mouth at bedtime 4)  Flonase 50 Mcg/act Susp (Fluticasone propionate) .... 2 sprays  each nare once daily as needed 5)  Ativan 1 Mg Tabs (Lorazepam) .... As needed 6)  Calcium 500/d 500-400 Mg-unit Chew (Calcium-vitamin d) .... Three times a day 7)  Bl Magnesium 250 Mg Tabs (Magnesium) .... Three times a day 8)  B-6 100 Mg  .... Take 1 tablet by mouth once a day 9)  B-1 100 Mg  .... Take 1 tablet by mouth once a day 10)  Bl Multiple Vitamins Tabs (Multiple vitamins-minerals) .... Take 1 tablet by mouth once a day 11)  Prozac 20 Mg Caps (Fluoxetine hcl) .... Take 1 tablet by mouth once a day 12)  Proventil Hfa 108 (90 Base) Mcg/act Aers (Albuterol sulfate) .... As directed as needed 13)  Advair Diskus 250-50 Mcg/dose Misc (Fluticasone-salmeterol) .Marland Kitchen.. 1 puff two times a day as needed 14)  Carafate 1 Gm/22ml Susp (Sucralfate) .... 2 teaspoonfuls  1 hr prior to meals and at bedtime  as needed 15)  Claritin-d 24 Hour 10-240 Mg Tb24 (Loratadine-pseudoephedrine) .... Take 1 tablet by mouth once a day as needed 16)  Cvs Vitamin B12 1000 Mcg Tabs (Cyanocobalamin) .... Take 1 tablet by mouth once a day 17)  Vicodin 5-500 Mg Tabs (Hydrocodone-acetaminophen) .Marland Kitchen.. 1 tab by mouth q 6 hours as needed pain. limit use as much as possible.    ]

## 2010-06-21 NOTE — Progress Notes (Signed)
----   Converted from flag ----  pt advised, appt cancelled Tasha  ---- 05/25/2010 9:55 PM, Kerby Nora MD wrote: LAbs were well controlled when checked 3 months ago in 01/2010..no labs needed unless pt has concerns.   ---- 05/24/2010 1:09 PM, Liane Comber CMA (AAMA) wrote: Lab orders please! Good Morning! This pt is scheduled for cpx labs Tuesday, which labs to draw and dx codes to use? Thanks Tasha ------------------------------

## 2010-06-21 NOTE — Progress Notes (Signed)
Summary: Need appt  Phone Note Outgoing Call   Call placed by: Mervin Hack CMA [facility] Dull),  May 23, 2010 11:57 AM Call placed to: Patient Summary of Call: Pt came in today for Vit B-12 injection,  last time she was seen by Dr.Samhitha Rosen was 12/10 for CPX, advised pt to make appt for physical. Initial call taken by: DeShannon Katrinka Blazing CMA [facility]),  May 23, 2010 11:58 AM  Follow-up for Phone Call        Pt stated she would  make appt. DeShannon Smith CMA [facility] Dull)  May 23, 2010 11:58 AM

## 2010-06-21 NOTE — Assessment & Plan Note (Signed)
Summary: CPX/JRR   Vital Signs:  Patient profile:   65 year old female Height:      64 inches Weight:      213.50 pounds BMI:     36.78 Temp:     97.8 degrees F oral Pulse rate:   80 / minute Pulse rhythm:   regular BP sitting:   110 / 70  (left arm) Cuff size:   large  Vitals Entered By: Benny Lennert CMA [facility]) (June 05, 2010 3:52 PM)  History of Present Illness: Chief complaint annual  wellness visit  I have personally reviewed the Medicare Annual Wellness questionnaire and have noted 1.   The patient's medical and social history 2.   Their use of alcohol, tobacco or illicit drugs 3.   Their current medications and supplements 4.   The patient's functional ability including ADL's, fall risks, home safety risks and hearing or visual             impairment. 5.   Diet and physical activities 6.   Evidence for depression or mood disorders The patients weight, height, BMI and visual acuity have been recorded in the chart I have made referrals, counseling and provided education to the patient based review of the above and I have provided the pt with a written personalized care plan for preventive services.   Depression: good control on prozac and ativan as needed.  Memory and energy.. has improved significantly on B12 shots every 2 weeks.   Larey Seat once last year in 08/2009. no falls since.  Chronic back issues, right knee pain...dx with Baker's cyst... saw [facility]...a steroid injection in spine and right knee helped. Went to water rehab. Now has resolved completely.  Asthma, stable on advair and albuterol as needed only using these seasonally.     Hypertension History:      She denies headache, chest pain, palpitations, dyspnea with exertion, neurologic problems, syncope, and side effects from treatment.  Well controlled on current meds. .        Positive major cardiovascular risk factors include female age 8 years old or older, hyperlipidemia, and  hypertension.  Negative major cardiovascular risk factors include non-tobacco-user status.    Lipid Management History:      Positive NCEP/ATP III risk factors include female age 90 years old or older and hypertension.  Negative NCEP/ATP III risk factors include HDL cholesterol greater than 60 and non-tobacco-user status.        Comments: on simvastatin 20 mg daily.    Preventive Screening-Counseling & Management  Alcohol-Tobacco     Alcohol drinks/day: 1     Alcohol type: red wine in sprite     Smoking Status: never  Caffeine-Diet-Exercise     Diet Comments: moderate     Diet Counseling: to improve diet; diet is suboptimal     Nutrition Referrals: no     Does Patient Exercise: no     Exercise Counseling: to improve exercise regimen  Problems Prior to Update: 1)  Memory Loss  (ICD-780.93) 2)  Unspecified Essential Hypertension  (ICD-401.9) 3)  Hypercholesterolemia  (ICD-272.0) 4)  Preventive Health Care  (ICD-V70.0) 5)  Osteopenia  (ICD-733.90) 6)  Other Screening Mammogram  (ICD-V76.12) 7)  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 Take 1 Tablet By Mouth Once A Day 2)  Synthroid 125 Mcg Tabs (Levothyroxine Sodium) .Marland Kitchen.. 1 Tab By Mouth Daily 3)  Ativan 1 Mg  Tabs (Lorazepam) .... As Needed 4)  Calcium 500/d 500-400 Mg-Unit  Chew (Calcium-Vitamin D) .... Three Times A Day 5)  Bl Magnesium 250 Mg  Tabs (Magnesium) .... Three Times A Day 6)  Bl Multiple Vitamins   Tabs  (Multiple Vitamins-Minerals) .... Take 1 Tablet By Mouth Once A Day 7)  Prozac 40 Mg Caps (Fluoxetine Hcl) .Marland Kitchen.. 1 Tab By Mouth Daily 8)  Proventil Hfa 108 (90 Base) Mcg/act  Aers (Albuterol Sulfate) .... As Directed As Needed 9)  Advair Diskus 250-50 Mcg/dose  Misc (Fluticasone-Salmeterol) .Marland Kitchen.. 1 Puff Two Times A Day As Needed 10)  Claritin-D 24 Hour 10-240 Mg  Tb24 (Loratadine-Pseudoephedrine) .... Take 1 Tablet By Mouth Once A Day As Needed 11)  Simvastatin 20 Mg Tabs (Simvastatin) .... Take 1 Tablet By Mouth Once A Day 12)  B-12 Injection .... Every Two Weeks Prn  Allergies: 1)  ! Keflex 2)  ! * Shellfish  Past History:  Past medical, surgical, family and social histories (including risk factors) reviewed, and no changes noted (except as noted below).  Past Medical History: Reviewed history from 08/18/2006 and no changes required. Allergic rhinitis Asthma, MILD INTERMITTENT Depression Hypothyroidism  Past Surgical History: Reviewed history from 08/18/2006 and no changes required. 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 1990 AND 2005 STRESS TEST - NML 1985  RECOMMENDED BY ID DOC - CHRONIC C.DIFF. (ANY ABX PRESCRIBED, ALSO GIVE VANCOMYCIN)  Family History: Reviewed history from 08/18/2006 and no changes required. Father: ALIVE AT 50, SMOKER, SCHIZOPHRENIC Mother: DEAD 37 PANCREATIC CA, ETOH Siblings: 1 SISTER, HEALTHY BREAST CA: PATERNAL AUNT uncle: ETOH,  STOMACH CA AUNT: BRAIN TUMOR  Social History: Reviewed history from 08/18/2006 and no changes required. 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 CV:  Denies chest pain or discomfort. Resp:  Denies shortness of breath, sputum productive, and wheezing. GI:  Denies abdominal pain and bloody stools. GU:  Denies dysuria.  Physical Exam  General:  obese appearing female in NAD  Eyes:  No corneal or conjunctival inflammation noted. EOMI. Perrla. Funduscopic exam benign, without hemorrhages, exudates or papilledema. Vision grossly normal. Ears:  External ear exam shows no significant lesions or deformities.  Otoscopic examination reveals clear canals, tympanic membranes are intact bilaterally without bulging, retraction, inflammation or discharge. Hearing is grossly normal bilaterally. Nose:  External nasal examination shows no deformity or inflammation. Nasal mucosa are pink and moist without lesions or exudates. Mouth:  Oral mucosa and oropharynx without lesions or exudates.  Teeth in good repair. Neck:  no carotid bruit or thyromegaly no cervical or supraclavicular lymphadenopathy  Chest Wall:  No deformities, masses, or tenderness noted. Breasts:  No mass, nodules, thickening, tenderness,  bulging, retraction, inflamation, nipple discharge or skin changes noted.   Lungs:  Normal respiratory effort, chest expands symmetrically. Lungs are clear to auscultation, no crackles or wheezes. Heart:  Normal rate and regular rhythm. S1 and S2 normal without gallop, murmur, click, rub or other extra sounds. Abdomen:  Bowel sounds positive,abdomen soft and non-tender without masses, organomegaly or hernias noted. Pulses:  R and L posterior tibial pulses are full and equal bilaterally  Extremities:  no edema  Neurologic:  No cranial nerve deficits noted. Station and gait are normal. Plantar reflexes are down-going bilaterally. DTRs are symmetrical throughout. Sensory, motor and coordinative functions appear intact.finger-to-nose normal, and heel-to-shin normal.  gait normal, finger-to-nose normal, heel-to-shin  normal, and Romberg negative.   Psych:  Cognition and judgment appear intact. Alert and cooperative with normal attention span and concentration. No apparent delusions, illusions, hallucinations   Impression & Recommendations:  Problem # 1:  PREVENTIVE HEALTH CARE (ICD-V70.0)  The patient's preventative maintenance and recommended screening tests for an annual wellness exam were reviewed in full today. Brought up to date unless services declined.  Counselled on the importance of diet, exercise, and its role in overall health and mortality. The patient's FH and SH was reviewed, including their home life, tobacco status, and drug and alcohol status.     Orders: Welcome to Medicare, Physical [phone])  Problem # 2:  HYPERCHOLESTEROLEMIA (ICD-272.0) Well controlled. Continue current medication.   Her updated medication list for this problem includes:    Simvastatin 20 Mg Tabs (Simvastatin) .Marland Kitchen... Take 1 tablet by mouth once a day  Labs Reviewed: SGOT: 20 (01/25/2010)   SGPT: 12 (01/25/2010)  Lipid Goals: Chol Goal: 200 (06/05/2010)   HDL Goal: 40 (06/05/2010)   LDL Goal: 160 (06/05/2010)   TG Goal: 150 (06/05/2010)  10 Yr Risk Heart Disease: 4 %   HDL:63.50 (01/25/2010), 70.10 (06/14/2009)  LDL:107 (01/25/2010), 88 (60/45/4098)  Chol:188 (01/25/2010), 176 (06/14/2009)  Trig:88.0 (01/25/2010), 91.0 (06/14/2009)  Problem # 3:  ANXIETY (ICD-300.00) Stable on prozac. Refilled ativan Her updated medication list for this problem includes:    Ativan 1 Mg Tabs (Lorazepam) .Marland Kitchen... As needed    Prozac 40 Mg Caps (Fluoxetine hcl) .Marland Kitchen... 1 tab by mouth daily  Problem # 4:  HYPOTHYROIDISM (ICD-244.9) Well controlled. Continue current medication.  Her updated medication list for this problem includes:    Synthroid 125 Mcg Tabs (Levothyroxine sodium) .Marland Kitchen... 1 tab by mouth daily  Problem # 5:  ASTHMA, MILD INTERMITTENT (ICD-493.90)  Stable. The following medications were removed from the  medication list:    Prednisone 20 Mg Tabs (Prednisone) .Marland Kitchen... 2 tabs daily for 5 days, then 1 tab daily for 5 days for asthma flare Her updated medication list for this problem includes:    Proventil Hfa 108 (90 Base) Mcg/act Aers (Albuterol sulfate) .Marland Kitchen... As directed as needed    Advair Diskus 250-50 Mcg/dose Misc (Fluticasone-salmeterol) .Marland Kitchen... 1 puff two times a day as needed  Pulmonary Functions Reviewed: PEFR: 290 (11/26/2007)     Problem # 6:  MEMORY LOSS (ICD-780.93) Improved with B12.. if pt noting further decline consider referral to neurology.  paryt of MMSE done today.. poor short term memory.   Problem # 7:  UNSPECIFIED ESSENTIAL HYPERTENSION (ICD-401.9) Well controlel dwith diet. Encouraged exercise, weight loss, healthy eating habits.   BP today: 110/70 Prior BP: 112/60 (06/30/2009)  10 Yr Risk Heart Disease: 4 %  Labs Reviewed: K+: 4.4 (01/25/2010) Creat: : 0.8 (01/25/2010)   Chol: 188 (01/25/2010)  HDL: 63.50 (01/25/2010)   LDL: 107 (01/25/2010)   TG: 88.0 (01/25/2010)  Complete Medication List: 1)  Estradiol 1 Mg Tabs (Estradiol) .... Take 1 tablet by mouth once a day 2)  Synthroid 125 Mcg Tabs (Levothyroxine sodium) .Marland Kitchen.. 1 tab by mouth daily 3)  Ativan 1 Mg Tabs (Lorazepam) .... As needed 4)  Calcium 500/d 500-400 Mg-unit Chew (Calcium-vitamin d) .... Three times a day 5)  Bl Magnesium 250 Mg Tabs (Magnesium) .... Three times a day 6)  Bl Multiple Vitamins Tabs (Multiple vitamins-minerals) .... Take 1 tablet by mouth once a day 7)  Prozac 40 Mg Caps (Fluoxetine hcl) .Marland Kitchen.. 1 tab by mouth daily 8)  Proventil Hfa 108 (90 Base) Mcg/act Aers (Albuterol sulfate) .... As directed as needed 9)  Advair Diskus 250-50 Mcg/dose Misc (Fluticasone-salmeterol) .Marland Kitchen.. 1 puff two times a day as needed 10)  Claritin-d 24 Hour 10-240 Mg Tb24 (Loratadine-pseudoephedrine) .... Take 1 tablet by mouth once a day as needed 11)  Simvastatin 20 Mg Tabs (Simvastatin) .... Take 1 tablet by  mouth once a day 12)  B-12 Injection  .... Every two weeks prn  Other Orders: Pneumococcal Vaccine (60454) Admin 1st Vaccine (09811) Radiology Referral (Radiology)  Hypertension Assessment/Plan:      The patient's hypertensive risk group is category B: At least one risk factor (excluding diabetes) with no target organ damage.  Her calculated 10 year risk of coronary heart disease is 4 %.  Today's blood pressure is 110/70.  Her blood pressure goal is < 140/90.  Lipid Assessment/Plan:      Based on NCEP/ATP III, the patient's risk factor category is "0-1 risk factors".  The patient's lipid goals are as follows: Total cholesterol goal is 200; LDL cholesterol goal is 160; HDL cholesterol goal is 40; Triglyceride goal is 150.  Her LDL cholesterol goal has been met.    Patient Instructions: 1)  I have provided you with a copy of your personalized plan for preventive services. Please take the time to review along with your updated medication list.  2)  Referral Appointment Information 3)  Day/Date: 4)  Time: 5)  Place/MD: 6)  Address: 7)  Phone/Fax: 8)  Patient given appointment information. Information/Orders faxed/mailed.  9)  Get back on track with exercise (water aerobic).Work on healthy eating habits and weight loss. 10)  Please schedule a follow-up appointment in 1 year.  Prescriptions: ATIVAN 1 MG  TABS (LORAZEPAM) as needed  #30 x 0   Entered and Authorized by:   Kerby Nora MD   Signed by:   Kerby Nora MD on 06/05/2010   Method used:   Print then Give to Patient   RxID:   [id number]    Orders Added: 1)  Welcome to Medicare, Physical [G0402] 2)  Est. Patient Level III [78469] 3)  Pneumococcal Vaccine [90732] 4)  Admin 1st Vaccine [90471] 5)  Radiology Referral [Radiology]   Immunizations Administered:  Pneumonia Vaccine:    Vaccine Type: Pneumovax (Medicare)    Site: left deltoid    Mfr: Merck    Dose: 0.5 ml    Route: [place]    Given by: Benny Lennert CMA  (AAMA)    Exp. Date: 10/12/2011    Lot #: [id number]    VIS given: 04/24/09 version given June 05, 2010.   Immunizations Administered:  Pneumonia Vaccine:    Vaccine Type: Pneumovax (Medicare)    Site: left deltoid    Mfr: Merck    Dose: 0.5 ml  Route: [place]    Given by: Benny Lennert CMA (AAMA)    Exp. Date: 10/12/2011    Lot #: [id number]    VIS given: 04/24/09 version given June 05, 2010.  Current Allergies (reviewed today): ! KEFLEX ! * SHELLFISH  Flu Vaccine Next Due:  Refused TD Next Due:  Refused Pneumovax Result Date:  05/29/2010 Pneumovax Result:  given Pneumovax Next Due:  5 yr Herpes Zoster Next Due:  Refused Last Bone Density:  Lumbar Spine:  T Score > -1.0 Spine.  Hip Total: T Score -2.5 to -1.0 Hip.     (05/29/2009 3:57:09 PM) Bone Density Next Due: 2 yr     Past Medical History:    Reviewed history from 08/18/2006 and no changes required:       Allergic rhinitis       Asthma, MILD INTERMITTENT       Depression       Hypothyroidism  Past Surgical History:    Reviewed history from 08/18/2006 and no changes required:       GASTRIC BYPASS - LOST 120 LBS. 2005       C.DIFFICILE - HOSP. 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 1990 AND 2005       STRESS TEST - NML 1985              RECOMMENDED BY ID DOC - CHRONIC C.DIFF. (ANY ABX PRESCRIBED, ALSO GIVE VANCOMYCIN)

## 2010-06-21 NOTE — Letter (Signed)
Summary: Nature conservation officer [employer] Wellness Visit Questionnaire   [employer] Annual Wellness Visit Questionnaire   Imported By: Beau Fanny 06/06/2010 16:29:53  _____________________________________________________________________  External Attachment:    Type:   Image     Comment:   External Document

## 2010-06-21 NOTE — Assessment & Plan Note (Signed)
Summary: b12/alc  Nurse Visit   Allergies: 1)  ! Keflex 2)  ! * Shellfish  Medication Administration  Injection # 1:    Medication: Vit B12 1000 mcg    Diagnosis: OTHER MALAISE AND FATIGUE (ICD-780.79)    Route: IM    Site: L deltoid    Exp Date: 02/18/2012    Lot #: [id number]    Mfr: [employer]    Patient tolerated injection without complications    Given by: Mervin Hack CMA (AAMA) (May 23, 2010 11:57 AM)  Orders Added: 1)  Vit B12 1000 mcg [J3420] 2)  Admin of Therapeutic Inj  intramuscular or subcutaneous [[id number]]   Medication Administration  Injection # 1:    Medication: Vit B12 1000 mcg    Diagnosis: OTHER MALAISE AND FATIGUE (ICD-780.79)    Route: IM    Site: L deltoid    Exp Date: 02/18/2012    Lot #: [id number]    Mfr: [employer]ent    Patient tolerated injection without complications    Given by: Mervin Hack CMA (AAMA) (May 23, 2010 11:57 AM)  Orders Added: 1)  Vit B12 1000 mcg [J3420] 2)  Admin of Therapeutic Inj  intramuscular or subcutaneous [[id number]]

## 2010-06-27 NOTE — Assessment & Plan Note (Signed)
Summary: B12 SHOT / LFW  Nurse Visit   Allergies: 1)  ! Keflex 2)  ! 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)    Route: IM    Site: R deltoid    Exp Date: 03/19/2012    Lot #: [id number]    Mfr: [employer]ent    Comments: Per Dr. Ermalene Searing    Patient tolerated injection without complications    Given by: Selena Batten Dance CMA (AAMA) (June 19, 2010 10:36 AM)  Orders Added: 1)  Vit B12 1000 mcg [J3420] 2)  Admin of Therapeutic Inj  intramuscular or subcutaneous [16109]

## 2010-07-03 ENCOUNTER — Ambulatory Visit (INDEPENDENT_AMBULATORY_CARE_PROVIDER_SITE_OTHER): Payer: Medicare Other | Admitting: Family Medicine

## 2010-07-03 ENCOUNTER — Encounter: Payer: Self-pay | Admitting: Family Medicine

## 2010-07-03 DIAGNOSIS — R05 Cough: Secondary | ICD-10-CM

## 2010-07-03 DIAGNOSIS — R413 Other amnesia: Secondary | ICD-10-CM

## 2010-07-11 NOTE — Assessment & Plan Note (Signed)
Summary: COUGH/CLE   MEDICARE/BCBS   Vital Signs:  Patient profile:   65 year old female Height:      64 inches Weight:      215 pounds BMI:     37.04 O2 Sat:      98 % on Room air Temp:     97.9 degrees F oral Pulse rate:   80 / minute Pulse rhythm:   regular BP sitting:   124 / 76  (left arm) Cuff size:   large  Vitals Entered By: Delilah Shan CMA Caressa Scearce Dull) (July 03, 2010 12:27 PM)  O2 Flow:  Room air CC: Cough   History of Present Illness: H/o asthma and recent cough.  cough started about 3-4 days ago, worse at night. Leaving town for [place]- grandson is having sugery.  No fevers.  Occ sputum, mostly dry cough.  No wheeze yet per patient.  Using advair and proventil, both help some now with the cough.  Use otc robitussin and she couldn't keep that down.  that was the only episode of vomiting.  H/o gastric bypass.  Sick contacts at home.  H/o Cdiff colitis.   Allergies: 1)  ! Keflex 2)  ! * Shellfish  Review of Systems       See HPI.  Otherwise negative.    Physical Exam  General:  no apparent distress normocephalic atraumatic tm wnl nasal exam wnl op w/o erythema neck supple regular rate and rhythm clear to auscultation bilaterally no wheeze, no increase in wob ext well perfused.   Impression & Recommendations:  Problem # 1:  COUGH (ICD-786.2) I would not use antibiotics, esp with h/o Cdiff colitis.  This is likely viral.  She is well apearing and lungs are clear to auscultation bilaterally.  This should gradually improve.  I would continue her inhalers and follow up as needed.  D/w patient and she understood.  follow up as needed .  Complete Medication List: 1)  Estradiol 1 Mg Tabs (Estradiol) .... Take 1 tablet by mouth once a day 2)  Synthroid 125 Mcg Tabs (Levothyroxine sodium) .Marland Kitchen.. 1 tab by mouth daily 3)  Ativan 1 Mg Tabs (Lorazepam) .... As needed 4)  Calcium 500/d 500-400 Mg-unit Chew (Calcium-vitamin d) .... Three times a day 5)  Bl Magnesium  250 Mg Tabs (Magnesium) .... Three times a day 6)  Bl Multiple Vitamins Tabs (Multiple vitamins-minerals) .... Take 1 tablet by mouth once a day 7)  Prozac 40 Mg Caps (Fluoxetine hcl) .Marland Kitchen.. 1 tab by mouth daily 8)  Proventil Hfa 108 (90 Base) Mcg/act Aers (Albuterol sulfate) .... As directed as needed 9)  Advair Diskus 250-50 Mcg/dose Misc (Fluticasone-salmeterol) .Marland Kitchen.. 1 puff two times a day as needed 10)  Claritin-d 24 Hour 10-240 Mg Tb24 (Loratadine-pseudoephedrine) .... Take 1 tablet by mouth once a day as needed 11)  Simvastatin 20 Mg Tabs (Simvastatin) .... Take 1 tablet by mouth once a day 12)  B-12 Injection  .... Every two weeks prn  Other Orders: Admin of Therapeutic Inj  intramuscular or subcutaneous (04540) Vit B12 1000 mcg (J8119)  Patient Instructions: 1)  I would use the advair two times a day, rinse after use.  Use the proventil every 4 hours as needed.  Drink plenty of fluids and take tylenol as needed for pain/fever. This should gradually improve.  I would get a flu shot after you are feeling better.    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 Patient Level III [16109]    Current Allergies (reviewed today): ! KEFLEX ! 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 Patient Level III [60454]

## 2010-07-25 ENCOUNTER — Ambulatory Visit (INDEPENDENT_AMBULATORY_CARE_PROVIDER_SITE_OTHER): Payer: Medicare Other

## 2010-07-25 ENCOUNTER — Encounter: Payer: Self-pay | Admitting: Family Medicine

## 2010-07-25 DIAGNOSIS — R413 Other amnesia: Secondary | ICD-10-CM

## 2010-07-31 NOTE — Assessment & Plan Note (Signed)
Summary: b12  Nurse Visit   Allergies: 1)  ! Keflex 2)  ! 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

## 2010-08-16 ENCOUNTER — Ambulatory Visit (INDEPENDENT_AMBULATORY_CARE_PROVIDER_SITE_OTHER): Payer: Medicare Other | Admitting: Family Medicine

## 2010-08-16 DIAGNOSIS — E538 Deficiency of other specified B group vitamins: Secondary | ICD-10-CM

## 2010-08-16 MED ORDER — CYANOCOBALAMIN 1000 MCG/ML IJ SOLN
1000.0000 ug | Freq: Once | INTRAMUSCULAR | Status: AC
  Administered 2010-08-16: 1000 ug via INTRAMUSCULAR

## 2010-08-17 ENCOUNTER — Telehealth: Payer: Self-pay | Admitting: *Deleted

## 2010-08-17 NOTE — Progress Notes (Signed)
  Subjective:    Patient ID: Roberta Mooney, female    DOB: 05/14/1946, 65 y.o.   MRN: [id number]  HPI    B12 injection given by CMA Lugene Fuquay.      Review of Systems     Objective:   Physical Exam        Assessment & Plan:

## 2010-08-17 NOTE — Telephone Encounter (Signed)
Message copied by Delilah Shan on Fri Aug 17, 2010  1:54 PM ------      Message from: Britt, [place]      Created: Fri Aug 17, 2010  9:44 AM      Regarding: B 12 injection       Oluwateniola Leitch--- I have an open encounter on my desktop for this pt for what appears to be a B12 injection... But there is no order for me to sign. Did you or other RN give Ms. Hartmann a B12 injection?      Can you make sure the order was put in correctly?      If it is... Maybe I just need to close the encounter.            I am not in the office so just send me back a note to respond if you can.      Thanks, Amy

## 2010-09-19 ENCOUNTER — Ambulatory Visit: Payer: Medicare Other

## 2010-10-05 NOTE — Assessment & Plan Note (Signed)
[facility]                             [facility] OFFICE NOTE   NAME:Liotta, Odalis                      MRN:          [id number]  DATE:01/07/2006                            DOB:          1945/12/29    NEW-PATIENT HISTORY AND PHYSICAL:   CHIEF COMPLAINT:  [age]-year-old white female here to establish new  doctor.   HISTORY OF PRESENT ILLNESS:  Ms. Hathaway states that she moved here from  [place] approximately 1 month ago.  She moved here to be closer to her  daughter and to help out with the grandchildren.  Her daughter has recently  gone through a divorce secondary to finding out the fact that her husband  was a Oncologist.  Ms. Mochizuki reports that she has been having some  worsening of her depression and panic attack symptoms over the past few  months secondary to both her daughter's divorce as well as the fact that her  husband had an MI and coronary artery bypass graft, quadruple, within the  last month.  She states that she was seen in the emergency room on December 17, 2005 with a panic attack.  Other workup there at that time was normal.  She  has always had problems with depression and had been diagnosed in the past  with PTSD.  She takes Wellbutrin XL, but has concerns that this may not be  working as well as it used to.  She has been taking it for 5 years.  She has  not tried any other type of medication.  She feels that the Wellbutrin may  not be working since her gastric bypass secondary to the fact that it is an  extended-release tablet as opposed to something that she can absorb more  readily.   She also states that she has been having some irritation on the inside of  her cheeks and her tongue.  She denies any dysphagia.   She has a history of hypothyroidism and feels that she may be due for a  check on her thyroid hormone.  She is also due for all refills of all of her  medications.   PAST MEDICAL HISTORY:  1.  History of C. difficile x3 (she has seen a GI doctor and was      hospitalized several times for severe C. difficile).  The infectious      disease doctor stated that if any antibiotics are prescribed, she also      needs to be given vancomycin.  2. Hypothyroidism.  3. Allergic rhinitis.  4. PTSD secondary to an adopted son having antisocial psychopath behavior.  5. Asthma, mild, intermittent.  6. Panic attacks.  7. Depression.  8. Sleep apnea, resolved following gastric bypass.   HOSPITALIZATIONS/SURGERIES/PROCEDURES:  1. In 2005, gastric bypass and lost 120 pounds.  2. In 1990, C. difficile, hospitalization.  3. In 2002, back surgery in cervical spine with 2 artificial disks and      rods placed.  4. Deviated septum surgery.  5. In 1990, cholecystectomy.  6. Foot surgery for bone spurs.  7. In 1990,  hysterectomy, total.  8. Tonsillectomy.  9. In 2005, normal mammogram.  10.In 2005, colonoscopy.   ALLERGIES:  KEFLEX and SHELLFISH.   MEDICATIONS:  1. Wellbutrin XL 300 mg p.o. daily.  2. Menest 1.25 mg daily.  3. Synthroid 100 mcg daily.  4. Zyrtec 10 mg daily.  5. Calcium and vitamin D t.i.d.  6. Sublingual B12 daily.  7. B6 100 mg daily.  8. B1 100 mg daily.  9. Flintstones Chewables daily.  10.Advair 500/50 daily.  11.Flonase.  12.Albuterol inhaled daily.  13.Ativan periodically.   FAMILY HISTORY:  Father alive at age 52 with coronary artery disease and  schizophrenia.  Mother deceased at age 65 with pancreatic cancer and alcohol  use.  One sister who is healthy.  She does have a paternal aunt with breast  cancer.  There is no other family history of cancer.  She does have several  uncles with schizophrenia.  She has 2 other uncles with stomach cancer and  an aunt with an unknown type of brain tumor.   SOCIAL HISTORY:  She denies alcohol, tobacco and IV drug use.  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  She and her  husband also worked for a while in [employer], but are  retired from that.  She has been married for 40 years.  She has 2 daughters  and 1 son who are healthy, except for her son has anxiety.   PHYSICAL EXAMINATION:  VITAL SIGNS:  Height 64 inches, weight 161.  Blood  pressure 124/82, pulse 68, temperature 98 degrees Fahrenheit.  GENERAL:  A healthy-appearing overweight female in no apparent distress.  HEENT:  PERRLA.  Extraocular muscles intact.  Tympanic membranes clear.  There are white plaques across the inside of her buccal mucosa as well as  some white plaques at the base of her tongue suggesting candidal infection.  NECK:  No thyromegaly.  No lymphadenopathy.  PULMONARY:  Clear to auscultation bilaterally.  No wheezes, rales or  rhonchi.  CARDIOVASCULAR:  Regular rate and rhythm.  No murmurs, rubs, or gallops.  Normal PMI.  Pulses 2+.  No peripheral edema.  ABDOMEN:  Soft, nontender, normoactive bowel sounds.  No hepatosplenomegaly.  PSYCHIATRIC:  Appropriate affect.  Denies suicidal or homicidal ideations.   ASSESSMENT AND PLAN:  1. Hypothyroidism:  We will check a TSH today and we will dose her      Synthroid appropriately.  I will write a prescription for her for the      Synthroid, once we know what dose is needed.  2. Panic attacks:  At this point in time, they are improving.  She rarely      uses Ativan for these symptoms.  She is not requesting any at this      time.  3. Depression, poor control:  I will obtain records from her previous      doctor regarding this.  At this point in time, we will continue her on      the Wellbutrin XL 300 mg daily.  We can, at the next visit, try short-      acting Wellbutrin or try a different medication such as an selective      serotonin reuptake inhibitor.  4. Oral candidiasis, new:  She is given a prescription for nystatin, swish      and spit, 100,000 U/mL to use 4-6 mL four times daily for 48 hours     after her  symptoms resolve.   She may have the oral candidiasis because      of recent use of Advair Diskus.  5. Asthma, mild, intermittent:  It is unclear from her report as to      whether she recently had an exacerbation of her asthma, or whether this      is all panic-attack-related.  She was instructed to decrease from the      Advair 500/50 dose to 250/50 daily.  If her asthma is truly mildly      intermittent, she does not need this strong of a dose.  I will obtain      records from the past.  She states she rarely uses the albuterol      inhaler and very infrequently has symptoms from her asthma.  We may be      able to stop the Advair altogether.  6. Prevention:  It appears that she is due for a mammogram and may be due      for a colonoscopy, given the fact that she states she was doing them      every 1-2 years and has not done this in 2 years.  I will obtain old      records and will call her with the possibility as to whether we need to      evaluate this.  She will return to clinic following the receipt of her      records.                                   Kerby Nora, MD   AB/MedQ  DD:  01/07/2006  DT:  01/08/2006  Job #:  [id number]

## 2010-10-10 ENCOUNTER — Ambulatory Visit (INDEPENDENT_AMBULATORY_CARE_PROVIDER_SITE_OTHER): Payer: Medicare Other | Admitting: Family Medicine

## 2010-10-10 DIAGNOSIS — E538 Deficiency of other specified B group vitamins: Secondary | ICD-10-CM

## 2010-10-10 MED ORDER — CYANOCOBALAMIN 1000 MCG/ML IJ SOLN
1000.0000 ug | Freq: Once | INTRAMUSCULAR | Status: AC
  Administered 2010-10-10: 1000 ug via INTRAMUSCULAR

## 2010-10-10 NOTE — Progress Notes (Signed)
B12 injection given

## 2010-10-31 ENCOUNTER — Telehealth: Payer: Self-pay | Admitting: *Deleted

## 2010-10-31 NOTE — Telephone Encounter (Signed)
Pt is in [place] and will be there for a couple of months.  She is asking if we can call her B12 solution to [facility] and her daughter, who is a Designer, jewellery, can administer it.  She gets the injections every 2 weeks.  If this is ok she will need [facility] [place], phone [phone].

## 2010-10-31 NOTE — Telephone Encounter (Signed)
Okay to send requested prescription there.

## 2010-11-01 MED ORDER — CYANOCOBALAMIN 1000 MCG/ML IJ SOLN: 1000.0000 ug | INTRAMUSCULAR | Status: DC

## 2010-11-01 NOTE — Telephone Encounter (Signed)
rx called to pharmacy 

## 2010-11-11 ENCOUNTER — Other Ambulatory Visit: Payer: Self-pay | Admitting: Family Medicine

## 2010-11-12 ENCOUNTER — Other Ambulatory Visit: Payer: Self-pay | Admitting: Family Medicine

## 2010-11-12 NOTE — Telephone Encounter (Signed)
Patient not sen in over 1 year please advise

## 2010-11-12 NOTE — Telephone Encounter (Signed)
Patient not seen in 1 year please advise 

## 2010-11-12 NOTE — Telephone Encounter (Signed)
Spoke to patient she is in [place] with daughter b/c she is having a rough pregnancy but, she has enough medication until she comes back in September and will make appt then

## 2010-11-12 NOTE — Telephone Encounter (Signed)
Overdue for CPX... Refill until that appt made.

## 2010-12-31 ENCOUNTER — Other Ambulatory Visit: Payer: Self-pay | Admitting: *Deleted

## 2010-12-31 MED ORDER — ESTRADIOL 1 MG PO TABS: 1.0000 mg | ORAL_TABLET | Freq: Every day | ORAL | Status: DC

## 2011-03-11 ENCOUNTER — Other Ambulatory Visit: Payer: Self-pay | Admitting: Family Medicine

## 2011-03-20 ENCOUNTER — Telehealth: Payer: Self-pay

## 2011-03-20 NOTE — Telephone Encounter (Signed)
Pt wanted to see if had received flu shot this year at our office. Patient notified as instructed by telephone that I could not see where pt had received flu shot here. Pt is in [place] now and will get shot there.

## 2011-04-09 ENCOUNTER — Ambulatory Visit (INDEPENDENT_AMBULATORY_CARE_PROVIDER_SITE_OTHER): Payer: Medicare Other | Admitting: Family Medicine

## 2011-04-09 ENCOUNTER — Encounter: Payer: Self-pay | Admitting: Family Medicine

## 2011-04-09 DIAGNOSIS — R05 Cough: Secondary | ICD-10-CM

## 2011-04-09 DIAGNOSIS — F410 Panic disorder [episodic paroxysmal anxiety] without agoraphobia: Secondary | ICD-10-CM

## 2011-04-09 MED ORDER — VANCOMYCIN HCL 250 MG PO CAPS: 250.0000 mg | ORAL_CAPSULE | Freq: Four times a day (QID) | ORAL | Status: AC

## 2011-04-09 MED ORDER — AZITHROMYCIN 250 MG PO TABS: ORAL_TABLET | ORAL | Status: AC

## 2011-04-09 MED ORDER — LORAZEPAM 1 MG PO TABS: 1.0000 mg | ORAL_TABLET | Freq: Three times a day (TID) | ORAL | Status: DC | PRN

## 2011-04-09 NOTE — Patient Instructions (Signed)
I would try to get some rest and use the albuterol for cough.  If you don't improve, then start the antibiotics.  Take care.

## 2011-04-09 NOTE — Progress Notes (Signed)
H/o panic attacks, esp related to husband's illness.  Needs refill on BZD.  No ADE from the medicine.    H/o C diff and gastric bypass.  Had been rec'd to continue with vanc if other abx used.    duration of symptoms: 10 days Rhinorrhea: no Congestion: yes, now with post nasal gtt ear pain:no sore throat:no Cough:yes, with sputum production Myalgias: no other concerns: was given tessalon for cough at [facility] in [place].   No wheeze.  Fatigued.   No fevers.  She is minimally improved from prev.   Mult sick exposures.   Dec in cough with SABA use.    ROS: See HPI.  Otherwise negative.    Meds, vitals, and allergies reviewed.   GEN: nad, alert and oriented HEENT: mucous membranes moist, TM w/o erythema, nasal epithelium injected, resolved nose bleed noted, OP with cobblestoning NECK: supple w/o LA CV: rrr. PULM: ctab, no inc wob ABD: soft, +bs EXT: no edema

## 2011-04-10 ENCOUNTER — Encounter: Payer: Self-pay | Admitting: Family Medicine

## 2011-04-10 DIAGNOSIS — R05 Cough: Secondary | ICD-10-CM | POA: Insufficient documentation

## 2011-04-10 NOTE — Assessment & Plan Note (Signed)
Continue prn BZD. Refill done.

## 2011-04-10 NOTE — Assessment & Plan Note (Signed)
Nontoxic, would not start abx at this point as it could be viral.  If sx continue for a few more days, and she has ~2 weeks of sx, then start zmax and use concurrent vanc.  She agrees, understood.  Okay for outpatient f/u.

## 2011-06-13 ENCOUNTER — Ambulatory Visit (INDEPENDENT_AMBULATORY_CARE_PROVIDER_SITE_OTHER): Payer: Medicare Other | Admitting: *Deleted

## 2011-06-13 DIAGNOSIS — E538 Deficiency of other specified B group vitamins: Secondary | ICD-10-CM

## 2011-06-13 MED ORDER — CYANOCOBALAMIN 1000 MCG/ML IJ SOLN
1000.0000 ug | Freq: Once | INTRAMUSCULAR | Status: AC
  Administered 2011-07-05: 1000 ug via INTRAMUSCULAR

## 2011-07-05 ENCOUNTER — Ambulatory Visit (INDEPENDENT_AMBULATORY_CARE_PROVIDER_SITE_OTHER): Payer: Medicare Other | Admitting: *Deleted

## 2011-07-05 DIAGNOSIS — E538 Deficiency of other specified B group vitamins: Secondary | ICD-10-CM

## 2011-07-23 ENCOUNTER — Ambulatory Visit (INDEPENDENT_AMBULATORY_CARE_PROVIDER_SITE_OTHER): Payer: Medicare Other | Admitting: *Deleted

## 2011-07-23 DIAGNOSIS — E538 Deficiency of other specified B group vitamins: Secondary | ICD-10-CM

## 2011-07-23 MED ORDER — CYANOCOBALAMIN 1000 MCG/ML IJ SOLN
1000.0000 ug | Freq: Once | INTRAMUSCULAR | Status: AC
  Administered 2011-07-23: 1000 ug via INTRAMUSCULAR

## 2011-07-30 ENCOUNTER — Encounter: Payer: Self-pay | Admitting: Family Medicine

## 2011-07-30 ENCOUNTER — Ambulatory Visit (INDEPENDENT_AMBULATORY_CARE_PROVIDER_SITE_OTHER): Payer: Medicare Other | Admitting: Family Medicine

## 2011-07-30 VITALS — BP 136/78 | HR 72 | Temp 97.9°F | Wt 232.2 lb

## 2011-07-30 DIAGNOSIS — H6500 Acute serous otitis media, unspecified ear: Secondary | ICD-10-CM | POA: Insufficient documentation

## 2011-07-30 DIAGNOSIS — J40 Bronchitis, not specified as acute or chronic: Secondary | ICD-10-CM

## 2011-07-30 MED ORDER — AMOXICILLIN-POT CLAVULANATE 875-125 MG PO TABS: 1.0000 | ORAL_TABLET | Freq: Two times a day (BID) | ORAL | Status: AC

## 2011-07-30 MED ORDER — GUAIFENESIN-CODEINE 100-10 MG/5ML PO SYRP: 5.0000 mL | ORAL_SOLUTION | Freq: Every evening | ORAL | Status: AC | PRN

## 2011-07-30 NOTE — Progress Notes (Signed)
  Subjective:    Patient ID: Roberta Mooney, female    DOB: 25-Oct-1945, 66 y.o.   MRN: [id number]  HPI CC: cough/congestion  Several month history of congestion and cough productive of sputum "since October".  Told viral URTI back then.  started taking raw honey, cinnamon, epsom salt baths.  sxs returned Thursday (5-6 days).  Prior to this had 1 wk sxs free.  + HA, ST, ear pain and muffled bilaterally.  Coughing up yellow sputum (change from clear).  Endorses chills and nausea.  Occasional vomiting prior to mealtimes then would feel better (this has been going on for months).  No fevers.  No abd pain, rashes, chest pain/tightness.  No smokers at home, + sick contacts at home.  Told has COPD and borderline asthma.  H/o gastric bypass.  Past Medical History  Diagnosis Date  . Allergy   . Asthma     Mild Intermittent  . Depression   . Thyroid disease     Hypothyroidism     Review of Systems Per HPI    Objective:   Physical Exam  Nursing note and vitals reviewed. Constitutional: She appears well-developed and well-nourished. No distress.  HENT:  Head: Normocephalic and atraumatic.  Right Ear: External ear and ear canal normal. Decreased hearing is noted.  Left Ear: Hearing, tympanic membrane, external ear and ear canal normal.  Nose: No mucosal edema or rhinorrhea. Right sinus exhibits no maxillary sinus tenderness and no frontal sinus tenderness. Left sinus exhibits no maxillary sinus tenderness and no frontal sinus tenderness.  Mouth/Throat: Uvula is midline, oropharynx is clear and moist and mucous membranes are normal. No oropharyngeal exudate, posterior oropharyngeal edema, posterior oropharyngeal erythema or tonsillar abscesses.       R TM erythematous, thick yellow fluid behind TM L TM WNL.  Eyes: Conjunctivae and EOM are normal. Pupils are equal, round, and reactive to light. No scleral icterus.  Neck: Normal range of motion. Neck supple.  Cardiovascular: Normal rate,  regular rhythm, normal heart sounds and intact distal pulses.   No murmur heard. Pulmonary/Chest: Effort normal and breath sounds normal. No respiratory distress. She has no wheezes. She has no rales.  Lymphadenopathy:    She has no cervical adenopathy.  Skin: Skin is warm and dry. No rash noted.       Assessment & Plan:

## 2011-07-30 NOTE — Patient Instructions (Signed)
You do have fluid in ear and possible infection there.  Treat with augmentin twice daily for 10 days. Use simple mucinex with plenty of fluid to mobilize mucous out. For cough at night time - codeine cough syrup. May make you sleepy. Update [place] if not improving as expected, fever >101 or worsening productive cough.

## 2011-07-30 NOTE — Assessment & Plan Note (Signed)
With bronchitis. cheratussin for cough. Treat with augmentin twice daily for 10 days. If not improving update [place]. See pt instructions.

## 2011-08-14 ENCOUNTER — Ambulatory Visit (INDEPENDENT_AMBULATORY_CARE_PROVIDER_SITE_OTHER): Payer: Medicare Other | Admitting: *Deleted

## 2011-08-14 DIAGNOSIS — E538 Deficiency of other specified B group vitamins: Secondary | ICD-10-CM

## 2011-08-14 MED ORDER — CYANOCOBALAMIN 1000 MCG/ML IJ SOLN
1000.0000 ug | Freq: Once | INTRAMUSCULAR | Status: AC
  Administered 2011-08-14: 1000 ug via INTRAMUSCULAR

## 2011-08-28 ENCOUNTER — Ambulatory Visit (INDEPENDENT_AMBULATORY_CARE_PROVIDER_SITE_OTHER): Payer: Medicare Other | Admitting: Family Medicine

## 2011-08-28 DIAGNOSIS — E538 Deficiency of other specified B group vitamins: Secondary | ICD-10-CM

## 2011-08-28 MED ORDER — CYANOCOBALAMIN 1000 MCG/ML IJ SOLN
1000.0000 ug | Freq: Once | INTRAMUSCULAR | Status: AC
  Administered 2011-08-28: 1000 ug via INTRAMUSCULAR

## 2011-08-28 NOTE — Progress Notes (Signed)
Here for B12. Given IM without incident.

## 2011-10-28 ENCOUNTER — Telehealth: Payer: Self-pay | Admitting: Family Medicine

## 2011-10-28 NOTE — Telephone Encounter (Signed)
Patient would like to switch her PCP from Dr. Ermalene Searing to Dr. Para March because her husband sees Dr. Para March and that would make it easier on her.  Please advise as to your wishes on this.

## 2011-10-28 NOTE — Telephone Encounter (Signed)
Okay with me of okay with Dr. Ermalene Searing.  Schedule her next CPE with me.

## 2011-10-29 NOTE — Telephone Encounter (Signed)
Sounds fine 

## 2011-10-29 NOTE — Telephone Encounter (Signed)
I left a message on patient's voice mail to let her know it was ok to transfer from [facility] to Dr.Duncan.  She already had an appointment scheduled with Dr.Duncan on 10/30/11.

## 2011-10-30 ENCOUNTER — Ambulatory Visit (INDEPENDENT_AMBULATORY_CARE_PROVIDER_SITE_OTHER): Payer: Medicare Other | Admitting: Family Medicine

## 2011-10-30 ENCOUNTER — Encounter: Payer: Self-pay | Admitting: Family Medicine

## 2011-10-30 VITALS — BP 140/78 | HR 80 | Temp 98.0°F | Wt 236.0 lb

## 2011-10-30 DIAGNOSIS — E538 Deficiency of other specified B group vitamins: Secondary | ICD-10-CM

## 2011-10-30 DIAGNOSIS — E039 Hypothyroidism, unspecified: Secondary | ICD-10-CM

## 2011-10-30 DIAGNOSIS — Z9884 Bariatric surgery status: Secondary | ICD-10-CM

## 2011-10-30 DIAGNOSIS — F411 Generalized anxiety disorder: Secondary | ICD-10-CM

## 2011-10-30 DIAGNOSIS — F329 Major depressive disorder, single episode, unspecified: Secondary | ICD-10-CM

## 2011-10-30 DIAGNOSIS — E7800 Pure hypercholesterolemia, unspecified: Secondary | ICD-10-CM

## 2011-10-30 MED ORDER — LORAZEPAM 1 MG PO TABS: 1.0000 mg | ORAL_TABLET | Freq: Three times a day (TID) | ORAL | Status: DC | PRN

## 2011-10-30 MED ORDER — CYANOCOBALAMIN 1000 MCG/ML IJ SOLN
1000.0000 ug | Freq: Once | INTRAMUSCULAR | Status: AC
  Administered 2011-10-30: 1000 ug via INTRAMUSCULAR

## 2011-10-30 MED ORDER — FLUOXETINE HCL 20 MG PO TABS: 60.0000 mg | ORAL_TABLET | Freq: Every day | ORAL | Status: DC

## 2011-10-30 MED ORDER — LORAZEPAM 1 MG PO TABS: 1.0000 mg | ORAL_TABLET | Freq: Three times a day (TID) | ORAL | Status: AC | PRN

## 2011-10-30 NOTE — Patient Instructions (Addendum)
This summer, please get fasting labs done.  Take the ativan as needed and you can take either benadryl 25mg  or melatonin for sleep.   Take care.   Call me with an update in about 1-2 weeks, sooner if needed.

## 2011-10-30 NOTE — Progress Notes (Signed)
Her husband has been chronically ill, was having change in mentation/speech.  He was diagnosed with another CVA.  This has been a tremendous strain on her.  She was prev on lorazepam and SSRI.  He didn't agree with rehab and discharge is planned for later today.  She is tearful.  "It's tough to watch."  Home health is set up at home.  No SI/HI.  She lives with her son and his family, her daughter in law is helping some at home.  She gets panicked, tearful, sweaty, has nightmares.  She's been through counseling.  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    PMH and SH reviewed  ROS: See HPI, otherwise noncontributory.  Meds, vitals, and allergies reviewed.   nad but tearful Speech wnl Mmm rrr ctab Judgement intact

## 2011-10-31 NOTE — Assessment & Plan Note (Signed)
Will inc prozac to 60mg  and continue lorazepam prn with melatonin or benadryl at night for sleep.  We'll see how the next few days/weeks go and she'll update me.  She'll have home health in meantime.  She agrees with plan.  No Si/Hi. >25 min spent with face to face with patient, >50% counseling and/or coordinating care

## 2011-11-19 ENCOUNTER — Ambulatory Visit (INDEPENDENT_AMBULATORY_CARE_PROVIDER_SITE_OTHER): Payer: Medicare Other | Admitting: *Deleted

## 2011-11-19 DIAGNOSIS — E538 Deficiency of other specified B group vitamins: Secondary | ICD-10-CM

## 2011-11-19 MED ORDER — CYANOCOBALAMIN 1000 MCG/ML IJ SOLN
1000.0000 ug | Freq: Once | INTRAMUSCULAR | Status: AC
  Administered 2011-11-19: 1000 ug via INTRAMUSCULAR

## 2011-12-03 ENCOUNTER — Other Ambulatory Visit (INDEPENDENT_AMBULATORY_CARE_PROVIDER_SITE_OTHER): Payer: Medicare Other

## 2011-12-03 DIAGNOSIS — E538 Deficiency of other specified B group vitamins: Secondary | ICD-10-CM

## 2011-12-03 DIAGNOSIS — F329 Major depressive disorder, single episode, unspecified: Secondary | ICD-10-CM

## 2011-12-03 DIAGNOSIS — E039 Hypothyroidism, unspecified: Secondary | ICD-10-CM

## 2011-12-03 DIAGNOSIS — E7800 Pure hypercholesterolemia, unspecified: Secondary | ICD-10-CM

## 2011-12-04 LAB — COMPREHENSIVE METABOLIC PANEL WITH GFR
ALT: 14 U/L (ref 0–35)
AST: 24 U/L (ref 0–37)
Albumin: 3.6 g/dL (ref 3.5–5.2)
Alkaline Phosphatase: 89 U/L (ref 39–117)
BUN: 20 mg/dL (ref 6–23)
CO2: 25 meq/L (ref 19–32)
Calcium: 8.4 mg/dL (ref 8.4–10.5)
Chloride: 110 meq/L (ref 96–112)
Creatinine, Ser: 0.9 mg/dL (ref 0.4–1.2)
GFR: 70.92 mL/min
Glucose, Bld: 71 mg/dL (ref 70–99)
Potassium: 4.8 meq/L (ref 3.5–5.1)
Sodium: 141 meq/L (ref 135–145)
Total Bilirubin: 0.6 mg/dL (ref 0.3–1.2)
Total Protein: 6.9 g/dL (ref 6.0–8.3)

## 2011-12-04 LAB — LIPID PANEL
Cholesterol: 214 mg/dL — ABNORMAL HIGH (ref 0–200)
Total CHOL/HDL Ratio: 4

## 2011-12-04 LAB — CBC WITH DIFFERENTIAL/PLATELET
Basophils Absolute: 0.1 K/uL (ref 0.0–0.1)
Basophils Relative: 0.6 % (ref 0.0–3.0)
Eosinophils Absolute: 0.4 K/uL (ref 0.0–0.7)
Eosinophils Relative: 4.3 % (ref 0.0–5.0)
HCT: 35.1 % — ABNORMAL LOW (ref 36.0–46.0)
Hemoglobin: 11.3 g/dL — ABNORMAL LOW (ref 12.0–15.0)
Lymphocytes Relative: 48.8 % — ABNORMAL HIGH (ref 12.0–46.0)
Lymphs Abs: 4.2 K/uL — ABNORMAL HIGH (ref 0.7–4.0)
MCHC: 32.1 g/dL (ref 30.0–36.0)
MCV: 86 fl (ref 78.0–100.0)
Monocytes Absolute: 0.8 K/uL (ref 0.1–1.0)
Monocytes Relative: 9.3 % (ref 3.0–12.0)
Neutro Abs: 3.2 K/uL (ref 1.4–7.7)
Neutrophils Relative %: 37 % — ABNORMAL LOW (ref 43.0–77.0)
Platelets: 232 K/uL (ref 150.0–400.0)
RBC: 4.08 Mil/uL (ref 3.87–5.11)
RDW: 16.5 % — ABNORMAL HIGH (ref 11.5–14.6)
WBC: 8.6 K/uL (ref 4.5–10.5)

## 2011-12-04 LAB — VITAMIN B12: Vitamin B-12: 525 pg/mL (ref 211–911)

## 2011-12-04 LAB — TSH: TSH: 2.67 u[IU]/mL (ref 0.35–5.50)

## 2011-12-04 LAB — LDL CHOLESTEROL, DIRECT: Direct LDL: 125.6 mg/dL

## 2011-12-09 ENCOUNTER — Ambulatory Visit (INDEPENDENT_AMBULATORY_CARE_PROVIDER_SITE_OTHER): Payer: Medicare Other | Admitting: Family Medicine

## 2011-12-09 ENCOUNTER — Encounter: Payer: Self-pay | Admitting: Family Medicine

## 2011-12-09 VITALS — BP 102/60 | HR 92 | Temp 98.2°F | Wt 235.0 lb

## 2011-12-09 DIAGNOSIS — R110 Nausea: Secondary | ICD-10-CM

## 2011-12-09 DIAGNOSIS — F411 Generalized anxiety disorder: Secondary | ICD-10-CM

## 2011-12-09 DIAGNOSIS — E538 Deficiency of other specified B group vitamins: Secondary | ICD-10-CM

## 2011-12-09 DIAGNOSIS — D649 Anemia, unspecified: Secondary | ICD-10-CM | POA: Insufficient documentation

## 2011-12-09 DIAGNOSIS — E039 Hypothyroidism, unspecified: Secondary | ICD-10-CM

## 2011-12-09 LAB — IBC PANEL
Iron: 40 ug/dL — ABNORMAL LOW (ref 42–145)
Saturation Ratios: 7.4 % — ABNORMAL LOW (ref 20.0–50.0)

## 2011-12-09 MED ORDER — CYANOCOBALAMIN 1000 MCG/ML IJ SOLN
1000.0000 ug | Freq: Once | INTRAMUSCULAR | Status: DC
  Administered 2011-12-09: 1000 ug via INTRAMUSCULAR

## 2011-12-09 NOTE — Assessment & Plan Note (Signed)
Refer to GI. Unclear how much of this is related to caregiver stress and her current situation.  She agrees with plan.

## 2011-12-09 NOTE — Assessment & Plan Note (Signed)
tsh wnl, continue as is with current meds.

## 2011-12-09 NOTE — Assessment & Plan Note (Signed)
TSH and B12 wnl.  Will check iron panel.  Referring to GI due to GI sx as above.  Check IFOB.

## 2011-12-09 NOTE — Assessment & Plan Note (Signed)
Continue current meds, no Si/Hi and will proceed with GI eval as that is more pressing.  She agrees.  Contracts for safety.

## 2011-12-09 NOTE — Progress Notes (Signed)
H/o hypothyroidism, no ADE on replacement and TSH wnl.  D/w pt.  No neck mass.   Mood is still flat.  She is less engaged with others, ie public.  Rare ativan use for anxiety.  No SI/HI.  Caring for her husband has been difficult and this likely contributes.    GI sx- persistently nauseated for 3 months with some occ vomiting.  No blood in stool.  She does have h/o C diff and used vanc/activa for recent 3 week period of diarrhea.  Diarrhea resolved now.  H/o gastric bypass.   Mild anemia but with pica sx of chewing on ice.  No blood in stool.  No blood in vomitus.  D/w pt today.    Prev labs d/w pt.    PMH and SH reviewed  ROS: See HPI, otherwise noncontributory.  Meds, vitals, and allergies reviewed.   GEN: nad, alert and oriented HEENT: mucous membranes moist NECK: supple w/o LA CV: rrr PULM: ctab, no inc wob ABD: soft, +bs EXT: no edema SKIN: no acute rash

## 2011-12-09 NOTE — Patient Instructions (Addendum)
Go to the lab on the way out.  We'll contact you with your lab report. See Shirlee Limerick about your referral before you leave today. Don't change your meds in the meantime.

## 2011-12-10 ENCOUNTER — Encounter: Payer: Self-pay | Admitting: Gastroenterology

## 2011-12-10 ENCOUNTER — Ambulatory Visit (INDEPENDENT_AMBULATORY_CARE_PROVIDER_SITE_OTHER): Payer: Medicare Other | Admitting: Gastroenterology

## 2011-12-10 VITALS — BP 114/60 | HR 80 | Ht 63.5 in | Wt 235.2 lb

## 2011-12-10 DIAGNOSIS — K529 Noninfective gastroenteritis and colitis, unspecified: Secondary | ICD-10-CM

## 2011-12-10 DIAGNOSIS — K625 Hemorrhage of anus and rectum: Secondary | ICD-10-CM

## 2011-12-10 DIAGNOSIS — R110 Nausea: Secondary | ICD-10-CM

## 2011-12-10 DIAGNOSIS — Z9049 Acquired absence of other specified parts of digestive tract: Secondary | ICD-10-CM

## 2011-12-10 DIAGNOSIS — R197 Diarrhea, unspecified: Secondary | ICD-10-CM

## 2011-12-10 DIAGNOSIS — Z9089 Acquired absence of other organs: Secondary | ICD-10-CM

## 2011-12-10 DIAGNOSIS — R109 Unspecified abdominal pain: Secondary | ICD-10-CM

## 2011-12-10 DIAGNOSIS — D509 Iron deficiency anemia, unspecified: Secondary | ICD-10-CM

## 2011-12-10 DIAGNOSIS — Z9884 Bariatric surgery status: Secondary | ICD-10-CM

## 2011-12-10 MED ORDER — HYOSCYAMINE-PHENYLTOLOXAMINE 0.0625-15 MG PO CAPS: ORAL_CAPSULE | ORAL | Status: DC

## 2011-12-10 MED ORDER — ESOMEPRAZOLE MAGNESIUM 40 MG PO CPDR: 40.0000 mg | DELAYED_RELEASE_CAPSULE | Freq: Every day | ORAL | Status: DC

## 2011-12-10 MED ORDER — NA SULFATE-K SULFATE-MG SULF 17.5-3.13-1.6 GM/177ML PO SOLN: ORAL | Status: DC

## 2011-12-10 MED ORDER — MOVIPREP 100 G PO SOLR: 1.0000 | ORAL | Status: DC

## 2011-12-10 NOTE — Patient Instructions (Addendum)
You have been scheduled for a Endoscopy and Colonoscopy, instructions have been provided. Your prep solution has been sent to the pharmacy. Samples of Nexium and Digex have been given to you. CC:  Crawford Givens MD

## 2011-12-10 NOTE — Progress Notes (Signed)
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 She also is status post laparoscopic cholecystectomy, hysterectomy, and has multiple medical problems. She apparently was done well without significant GI complaints until the last 3 months when she describes acid reflux with periodic substernal chest pain but no dysphagia, vague abdominal discomfort, loose nonbloody stools, gas, bloating, and new onset iron deficiency anemia. She does have chronic back pain and is on regular Ativan, Prozac, and apparently hydrocodone. She's had no recent infectious disease problems, but does give a history of 3 previous episodes of C. Difficile diarrhea. Because of her back pain she does take her out of large doses of ibuprofen. She had colonoscopy 78 years ago, but has not had previous endoscopy. She denies abuse of alcohol or cigarettes. Family history is remarkable for colon cancer in her mother.  I have reviewed this patient's present history, medical and surgical past history, allergies and medications.     ROS: The remainder of the 10 point ROS is negative..multiple somatic complaints of confusion, anxiety and depression, agoraphobia, chronic fatigue, arthralgias and myalgias, and chronic intractable insomnia. She denies any specific cardiovascular, pulmonary, genitourinary problems at this time. Filed Vitals:   12/10/11 1406  BP: 114/60  Pulse: 80  Height: 5' 3.5" (1.613 m)  Weight: 235 lb 4 oz (106.709 kg)       Physical Exam: General well developed well nourished patient in no acute distress, appearing her stated age Eyes PERRLA, no icterus, fundoscopic exam per opthamologist Skin no lesions noted Neck supple, no adenopathy, no thyroid enlargement, no tenderness Chest clear to percussion and auscultation Heart no significant murmurs, gallops or rubs noted Abdomen no hepatosplenomegaly masses  or tenderness, BS normal.  Rectal inspection normal no fissures, or fistulae noted.  No masses or tenderness on digital exam. Stool guaiac positive.. Extremities no acute joint lesions, edema, phlebitis or evidence of cellulitis. Neurologic patient oriented x 3, cranial nerves intact, no focal neurologic deficits noted. Psychological mental status normal and normal affect.  Assessment and plan:Iron deficiency anemia in a 66 year old patient with previous gastric bypass surgery and Roux-en-Y biliary diversion. She's had acute onset of the last 3 months of nonspecific GI complaints, mostly diarrhea with gas and bloating. She gives a vague history of previous C. Difficile infection with antibiotic use, but has not been on antibiotics in the last year. I suspect she has a marginal ulceration related to NSAID use, but we need to exclude underlying inflammatory bowel disease, colonic polyposis, or chronic malabsorption syndrome. I mostly concerned about her guaiac positive stool, and have scheduled her for endoscopy and colonoscopy ASAP. She will need long-term iron replacement therapy, and probably B12, folic acid, thiamine, and other vitamins and nutrients associated with gastric bypass procedures.  Encounter Diagnoses  Name Primary?  . Nausea Yes  . Rectal bleeding   . Abdominal pain

## 2011-12-13 ENCOUNTER — Encounter: Payer: Self-pay | Admitting: Gastroenterology

## 2011-12-13 ENCOUNTER — Ambulatory Visit (AMBULATORY_SURGERY_CENTER): Payer: Medicare Other | Admitting: Gastroenterology

## 2011-12-13 ENCOUNTER — Other Ambulatory Visit: Payer: Medicare Other

## 2011-12-13 VITALS — BP 114/56 | HR 64 | Temp 98.4°F | Resp 19 | Ht 63.5 in | Wt 235.0 lb

## 2011-12-13 DIAGNOSIS — D1330 Benign neoplasm of unspecified part of small intestine: Secondary | ICD-10-CM

## 2011-12-13 DIAGNOSIS — D649 Anemia, unspecified: Secondary | ICD-10-CM

## 2011-12-13 DIAGNOSIS — R197 Diarrhea, unspecified: Secondary | ICD-10-CM

## 2011-12-13 DIAGNOSIS — R110 Nausea: Secondary | ICD-10-CM

## 2011-12-13 DIAGNOSIS — R109 Unspecified abdominal pain: Secondary | ICD-10-CM

## 2011-12-13 DIAGNOSIS — D126 Benign neoplasm of colon, unspecified: Secondary | ICD-10-CM

## 2011-12-13 DIAGNOSIS — Z1211 Encounter for screening for malignant neoplasm of colon: Secondary | ICD-10-CM

## 2011-12-13 DIAGNOSIS — Z9884 Bariatric surgery status: Secondary | ICD-10-CM

## 2011-12-13 DIAGNOSIS — K625 Hemorrhage of anus and rectum: Secondary | ICD-10-CM

## 2011-12-13 LAB — FECAL OCCULT BLOOD, IMMUNOCHEMICAL: Fecal Occult Bld: NEGATIVE

## 2011-12-13 MED ORDER — SODIUM CHLORIDE 0.9 % IV SOLN: 500.0000 mL | INTRAVENOUS | Status: DC

## 2011-12-13 NOTE — Progress Notes (Signed)
Patient did not experience any of the following events: a burn prior to discharge; a fall within the facility; wrong site/side/patient/procedure/implant event; or a hospital transfer or hospital admission upon discharge from the facility. (G8907) Patient did not have preoperative order for IV antibiotic SSI prophylaxis. (G8918)  

## 2011-12-13 NOTE — Op Note (Signed)
[facility] [address] [place], [place]  [address]  ENDOSCOPY PROCEDURE REPORT  PATIENT:  Roberta Mooney, Roberta Mooney  MR#:  [id number] BIRTHDATE:  04-Dec-1945, 65 yrs. old  GENDER:  female  ENDOSCOPIST:  Vania Rea. Jarold Motto, MD, [facility] Referred by:  Crawford Givens, M.D.  PROCEDURE DATE:  12/13/2011 PROCEDURE:  EGD with biopsy, 43239 ASA CLASS:  Class III INDICATIONS:  diarrhea HX OF GASTRIC BYPASS.++ STOOL GUAIAC  MEDICATIONS:   There was residual sedation effect present from prior procedure., propofol (Diprivan) 150 mg IV TOPICAL ANESTHETIC:  DESCRIPTION OF PROCEDURE:   After the risks and benefits of the procedure were explained, informed consent was obtained.  The endoscope was introduced through the mouth and advanced to the second portion of the duodenum.  The instrument was slowly withdrawn as the mucosa was fully examined. <<PROCEDUREIMAGES>>  s/p roux-en/y gastric bypass. ANASTOMOSIS NORMAL.SMALL BOWEL BX. The esophagus and gastroesophageal junction were completely normal in appearance.    Retroflexed views revealed not done.    The scope was then withdrawn from the patient and the procedure completed.  COMPLICATIONS:  None  ENDOSCOPIC IMPRESSION: 1) S/p roux-en/y gastric bypass 2) Not done VERY SMALL GASTRIC REMNANT APPEARS NORMAL.SI BX. DONE. RECOMMENDATIONS: 1) Await biopsy results  ______________________________ Vania Rea. Jarold Motto, MD, [facility]  CC:  n. eSIGNED:   Vania Rea. Itzae Miralles at 12/13/2011 02:08 PM  [facility], [phone]

## 2011-12-13 NOTE — Patient Instructions (Addendum)
FOLLOW UP APPOINTMENT  WITH DR. PATTERSON AUGUST [phone] AT 3:15.  TAKE HEMAX SAMPLES ONCE DAILY.    YOU HAD AN ENDOSCOPIC PROCEDURE TODAY AT THE [facility]: Refer to the procedure report that was given to you for any specific questions about what was found during the examination.  If the procedure report does not answer your questions, please call your gastroenterologist to clarify.  If you requested that your care partner not be given the details of your procedure findings, then the procedure report has been included in a sealed envelope for you to review at your convenience later.  YOU SHOULD EXPECT: Some feelings of bloating in the abdomen. Passage of more gas than usual.  Walking can help get rid of the air that was put into your GI tract during the procedure and reduce the bloating. If you had a lower endoscopy (such as a colonoscopy or flexible sigmoidoscopy) you may notice spotting of blood in your stool or on the toilet paper. If you underwent a bowel prep for your procedure, then you may not have a normal bowel movement for a few days.  DIET: Your first meal following the procedure should be a light meal and then it is ok to progress to your normal diet.  A half-sandwich or bowl of soup is an example of a good first meal.  Heavy or fried foods are harder to digest and may make you feel nauseous or bloated.  Likewise meals heavy in dairy and vegetables can cause extra gas to form and this can also increase the bloating.  Drink plenty of fluids but you should avoid alcoholic beverages for 24 hours.  ACTIVITY: Your care partner should take you home directly after the procedure.  You should plan to take it easy, moving slowly for the rest of the day.  You can resume normal activity the day after the procedure however you should NOT DRIVE or use heavy machinery for 24 hours (because of the sedation medicines used during the test).    SYMPTOMS TO REPORT IMMEDIATELY: A  gastroenterologist can be reached at any hour.  During normal business hours, 8:30 AM to 5:00 PM Monday through Friday, call [phone].  After hours and on weekends, please call the GI answering service at [phone] who will take a message and have the physician on call contact you.   Following lower endoscopy (colonoscopy or flexible sigmoidoscopy):  Excessive amounts of blood in the stool  Significant tenderness or worsening of abdominal pains  Swelling of the abdomen that is new, acute  Fever of 100F or higher  Following upper endoscopy (EGD)  Vomiting of blood or coffee ground material  New chest pain or pain under the shoulder blades  Painful or persistently difficult swallowing  New shortness of breath  Fever of 100F or higher  Black, tarry-looking stools  FOLLOW UP: If any biopsies were taken you will be contacted by phone or by letter within the next 1-3 weeks.  Call your gastroenterologist if you have not heard about the biopsies in 3 weeks.  Our staff will call the home number listed on your records the next business day following your procedure to check on you and address any questions or concerns that you may have at that time regarding the information given to you following your procedure. This is a courtesy call and so if there is no answer at the home number and we have not heard from you through the emergency physician on call,  we will assume that you have returned to your regular daily activities without incident.  SIGNATURES/CONFIDENTIALITY: You and/or your care partner have signed paperwork which will be entered into your electronic medical record.  These signatures attest to the fact that that the information above on your After Visit Summary has been reviewed and is understood.  Full responsibility of the confidentiality of this discharge information lies with you and/or your care-partner.

## 2011-12-13 NOTE — Op Note (Signed)
[facility] [address] [place], [place]  [address]  COLONOSCOPY PROCEDURE REPORT  PATIENT:  Roberta Mooney, Roberta Mooney  MR#:  [id number] BIRTHDATE:  04/22/1946, 65 yrs. old  GENDER:  female ENDOSCOPIST:  Vania Rea. Jarold Motto, MD, [facility] REF. BY:  Crawford Givens, M.D. PROCEDURE DATE:  12/13/2011 PROCEDURE:  Colonoscopy with biopsy ASA CLASS:  Class III INDICATIONS:  Routine Risk Screening, unexplained diarrhea, FOBT positive stool MEDICATIONS:   propofol (Diprivan) 150 mg IV  DESCRIPTION OF PROCEDURE:   After the risks and benefits and of the procedure were explained, informed consent was obtained. Digital rectal exam was performed and revealed no abnormalities. The  endoscope was introduced through the anus and advanced to the cecum, which was identified by both the appendix and ileocecal valve.  The quality of the prep was excellent, using MoviPrep. The instrument was then slowly withdrawn as the colon was fully examined. <<PROCEDUREIMAGES>>  FINDINGS:  No polyps or cancers were seen.  This was otherwise a normal examination of the colon. random biopsies done Retroflexed views in the rectum revealed no abnormalities.    The scope was then withdrawn from the patient and the procedure completed.  COMPLICATIONS:  None ENDOSCOPIC IMPRESSION: 1) No polyps or cancers 2) Otherwise normal examination R/O MICROSCOPIC/COLLAGENOUS COLITIS. RECOMMENDATIONS: 1) Await biopsy results 2) Continue current colorectal screening recommendations for "routine risk" patients with a repeat colonoscopy in 10 years.  REPEAT EXAM:  No  ______________________________ Vania Rea. Jarold Motto, MD, [facility]  CC:  n. eSIGNED:   Vania Rea. Blaize Nipper at 12/13/2011 02:01 PM  [facility], [phone]

## 2011-12-16 ENCOUNTER — Telehealth: Payer: Self-pay | Admitting: *Deleted

## 2011-12-16 NOTE — Telephone Encounter (Deleted)
  Follow up Call-  Call back number 12/13/2011  Post procedure Call Back phone  # [phone]  Permission to leave phone message Yes     Patient questions:  Do you have a fever, pain , or abdominal swelling? {yes no:314532} Pain Score  {NUMBERS; 0-10:5044} *  Have you tolerated food without any problems? {yes no:314532}  Have you been able to return to your normal activities? {yes no:314532}  Do you have any questions about your discharge instructions: Diet   {yes no:314532} Medications  {yes no:314532} Follow up visit  {yes no:314532}  Do you have questions or concerns about your Care? {yes no:314532}  Actions: * If pain score is 4 or above: {ACTION; [facility] ENDO PAIN >4:21563::"No action needed, pain <4."}

## 2011-12-16 NOTE — Telephone Encounter (Signed)
No answer. Phone number identifier. Message left to call if any questions or concerns.

## 2011-12-24 ENCOUNTER — Encounter: Payer: Self-pay | Admitting: Gastroenterology

## 2011-12-25 ENCOUNTER — Telehealth: Payer: Self-pay | Admitting: Gastroenterology

## 2011-12-25 NOTE — Telephone Encounter (Signed)
Informed pt of normal path results from [facility]. Pt is puzzled and wants to know why she tested positive for occult blood? I tried to explain about Capsule Endoscopy testing, but she wants your opinion. Please advise. Thanks.

## 2011-12-31 ENCOUNTER — Ambulatory Visit: Payer: Medicare Other | Admitting: Gastroenterology

## 2012-01-02 ENCOUNTER — Other Ambulatory Visit: Payer: Self-pay | Admitting: Family Medicine

## 2012-01-02 NOTE — Telephone Encounter (Signed)
Sent!

## 2012-01-06 ENCOUNTER — Other Ambulatory Visit: Payer: Self-pay | Admitting: *Deleted

## 2012-01-06 MED ORDER — LEVOTHYROXINE SODIUM 125 MCG PO TABS: 125.0000 ug | ORAL_TABLET | Freq: Every day | ORAL | Status: AC

## 2012-01-23 ENCOUNTER — Encounter: Payer: Self-pay | Admitting: Family Medicine

## 2012-01-23 ENCOUNTER — Ambulatory Visit (INDEPENDENT_AMBULATORY_CARE_PROVIDER_SITE_OTHER): Payer: Medicare Other | Admitting: Family Medicine

## 2012-01-23 VITALS — BP 124/70 | HR 80 | Temp 98.6°F | Wt 235.2 lb

## 2012-01-23 DIAGNOSIS — R252 Cramp and spasm: Secondary | ICD-10-CM

## 2012-01-23 DIAGNOSIS — D649 Anemia, unspecified: Secondary | ICD-10-CM

## 2012-01-23 DIAGNOSIS — R05 Cough: Secondary | ICD-10-CM

## 2012-01-23 MED ORDER — CYANOCOBALAMIN 1000 MCG/ML IJ SOLN
1000.0000 ug | Freq: Once | INTRAMUSCULAR | Status: AC
  Administered 2012-01-23: 1000 ug via INTRAMUSCULAR

## 2012-01-23 MED ORDER — CYCLOBENZAPRINE HCL 10 MG PO TABS: 5.0000 mg | ORAL_TABLET | Freq: Three times a day (TID) | ORAL | Status: AC | PRN

## 2012-01-23 MED ORDER — AZITHROMYCIN 250 MG PO TABS: ORAL_TABLET | ORAL | Status: AC

## 2012-01-23 NOTE — Patient Instructions (Addendum)
Drink plenty of fluids, take tylenol as needed, and gargle with warm salt water for your throat.  Start the zithromax today and use the flexeril as needed (it can make you drowsy).  This should gradually improve.  Take care.  Let us know if you have other concerns.   Call GI and see what they think about your timeline/plan.

## 2012-01-23 NOTE — Progress Notes (Signed)
Per patient, daughter was having surgery recently but then had a bowel obstruction, then she aspirated and died.  Pt just got back in town.  She took the ativan during the period of high stress with relief.   In meantime, no fever but inc in cough at night.  R ear pain, intermittent.  No sputum.  Fatigued and myalgias.    She'll have periodic muscle cramps in legs (after 10 hour drive- one way, standing at the funeral home).  She had taken flexeril prev.  Asking for refill.  No ADE prev.   Meds, vitals, and allergies reviewed.   ROS: See HPI.  Otherwise, noncontributory.  GEN: nad, alert and oriented HEENT: mucous membranes moist, tm w/o erythema, nasal exam w/o erythema, clear discharge noted,  OP with cobblestoning NECK: supple w/o LA CV: rrr.   PULM: ctab except for rhonchi in the LL, no inc wob EXT: no edema SKIN: no acute rash

## 2012-01-24 DIAGNOSIS — R252 Cramp and spasm: Secondary | ICD-10-CM | POA: Insufficient documentation

## 2012-01-24 NOTE — Assessment & Plan Note (Signed)
Use flexeril prn with sedation caution.  She agrees.

## 2012-01-24 NOTE — Assessment & Plan Note (Signed)
Start zithromax and f/u prn.  Nontoxic.  I offered my condolences re: her daughter.  She agrees with plan.

## 2012-01-30 ENCOUNTER — Telehealth: Payer: Self-pay

## 2012-01-30 NOTE — Telephone Encounter (Signed)
Will see then. 

## 2012-01-30 NOTE — Telephone Encounter (Signed)
Pt walked in rt side of face slightly swollen, still rt earache;productive cough with white phlegm; finished Z pack 2 days ago. No numbness in face, no weakness in extremities,no fever. Pt seen 01/23/12. Pt scheduled appt Dr Sharen Hones 01/31/12 @ 9:15 am. If condition were to change or worsen pt will go to [facility].

## 2012-01-31 ENCOUNTER — Ambulatory Visit (INDEPENDENT_AMBULATORY_CARE_PROVIDER_SITE_OTHER): Payer: Medicare Other | Admitting: Family Medicine

## 2012-01-31 ENCOUNTER — Encounter: Payer: Self-pay | Admitting: Family Medicine

## 2012-01-31 VITALS — BP 134/84 | HR 80 | Temp 97.9°F | Wt 253.0 lb

## 2012-01-31 DIAGNOSIS — R51 Headache: Secondary | ICD-10-CM

## 2012-01-31 MED ORDER — ALBUTEROL SULFATE HFA 108 (90 BASE) MCG/ACT IN AERS: 2.0000 | INHALATION_SPRAY | Freq: Four times a day (QID) | RESPIRATORY_TRACT | Status: AC | PRN

## 2012-01-31 MED ORDER — AMOXICILLIN-POT CLAVULANATE 875-125 MG PO TABS: 1.0000 | ORAL_TABLET | Freq: Two times a day (BID) | ORAL | Status: AC

## 2012-01-31 NOTE — Patient Instructions (Signed)
Exam normal today - you may have a bit of TMJ dysfunction - do stretching exercises as discussed. There could be early sinusitis so hold on to augmentin script and fill if not getting better as expected. nyquil at night, simple mucinex during the day with plenthy of fluid to mobilize mucous Let us know if not improving as expected. Lots of rest is needed as well to fully recuperate.

## 2012-01-31 NOTE — Progress Notes (Signed)
  Subjective:    Patient ID: Roberta Mooney, female    DOB: 12/03/1945, 66 y.o.   MRN: [id number]  HPI CC: R ear ache and facial swelling  Seen 01/23/2012 by PCP with R earache and cough, treated with zpack.  Told may have walking PNA.   Recently buried daughter in [place].  To return next week for several months.  In shock over this.  Daughter aspirated after bowel obstruction. "my immune system is weak".  About 10 d h/o URTI sxs, started with cough.  Cough now productive.  Continues intermittent R sided earache as well as swelling maxillary sinus feeling.  Initially ST.  + HA.  Congestion is much better.  Now able to cough up mucous better.  Has taken zpack, nyquil  No fevers/chills, abd pain, vomiting, tooth pain.   H/o colonoscopy 3 wks ago.  Recently had 3 fillings.  No sick contacts at home.  No smokers at home.  No h/o asthma.  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  Does not tolerate NSAIDs 2/2 h/o gastric bypass  Review of Systems Per HPI    Objective:   Physical Exam  Nursing note and vitals reviewed. Constitutional: She appears well-developed and well-nourished. No distress.  HENT:  Head: Normocephalic and atraumatic.  Right Ear: Hearing, tympanic membrane, external ear and ear canal normal.  Left Ear: Hearing, tympanic membrane, external ear and ear canal normal.  Nose: Nose normal. No mucosal edema or rhinorrhea. Right sinus exhibits no maxillary sinus tenderness and no frontal sinus tenderness. Left sinus exhibits no maxillary sinus tenderness and no frontal sinus tenderness.  Mouth/Throat: Uvula is midline and mucous membranes are normal. Posterior oropharyngeal erythema present. No oropharyngeal exudate, posterior oropharyngeal edema or tonsillar abscesses.       R TMJ doesn't move as much as L TMJ, but no tenderness  Eyes: Conjunctivae normal and EOM are normal. Pupils are equal, round, and reactive to light. No scleral icterus.  Neck: Normal range of  motion. Neck supple.  Cardiovascular: Normal rate, regular rhythm, normal heart sounds and intact distal pulses.   No murmur heard. Pulmonary/Chest: Effort normal and breath sounds normal. No respiratory distress. She has no wheezes. She has no rales.  Lymphadenopathy:    She has no cervical adenopathy.  Skin: Skin is warm and dry. No rash noted.       Assessment & Plan:

## 2012-01-31 NOTE — Assessment & Plan Note (Signed)
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 Not impressive for active continued infection. Could be early sinus infection - so provided with WASP mainly because of upcoming travel. Discussed only fill if not improving as expected or more sinusitis sxs.  Pt agrees with plan.

## 2012-04-10 ENCOUNTER — Other Ambulatory Visit: Payer: Self-pay

## 2012-04-10 MED ORDER — CYANOCOBALAMIN 1000 MCG/ML IJ SOLN: 1000.0000 ug | INTRAMUSCULAR | Status: AC

## 2012-04-10 NOTE — Telephone Encounter (Signed)
Dr Jarold Motto, please explain. Thanks.

## 2012-04-10 NOTE — Telephone Encounter (Signed)
Sent. Thanks.   

## 2012-04-10 NOTE — Telephone Encounter (Signed)
Pt left v/m has been in [place] for a while and will be in [place] for undetermined amt of time.pt request Vit B 12 injectable sent to [facility].Please advise. Pt request call back.

## 2012-04-13 NOTE — Telephone Encounter (Signed)
Do IFOB cards 

## 2012-04-13 NOTE — Telephone Encounter (Signed)
lmom for pt to call back

## 2012-04-21 NOTE — Telephone Encounter (Signed)
Path letter mailed

## 2012-07-09 IMAGING — CR DG KNEE COMPLETE 4+V*L*
4 series · 4 of 4 positions shown · non-contrast
Comparison: None.

CLINICAL DATA: Left-sided knee pain and popping sensation

LEFT KNEE - COMPLETE 4+ VIEW

[t knee ap left]
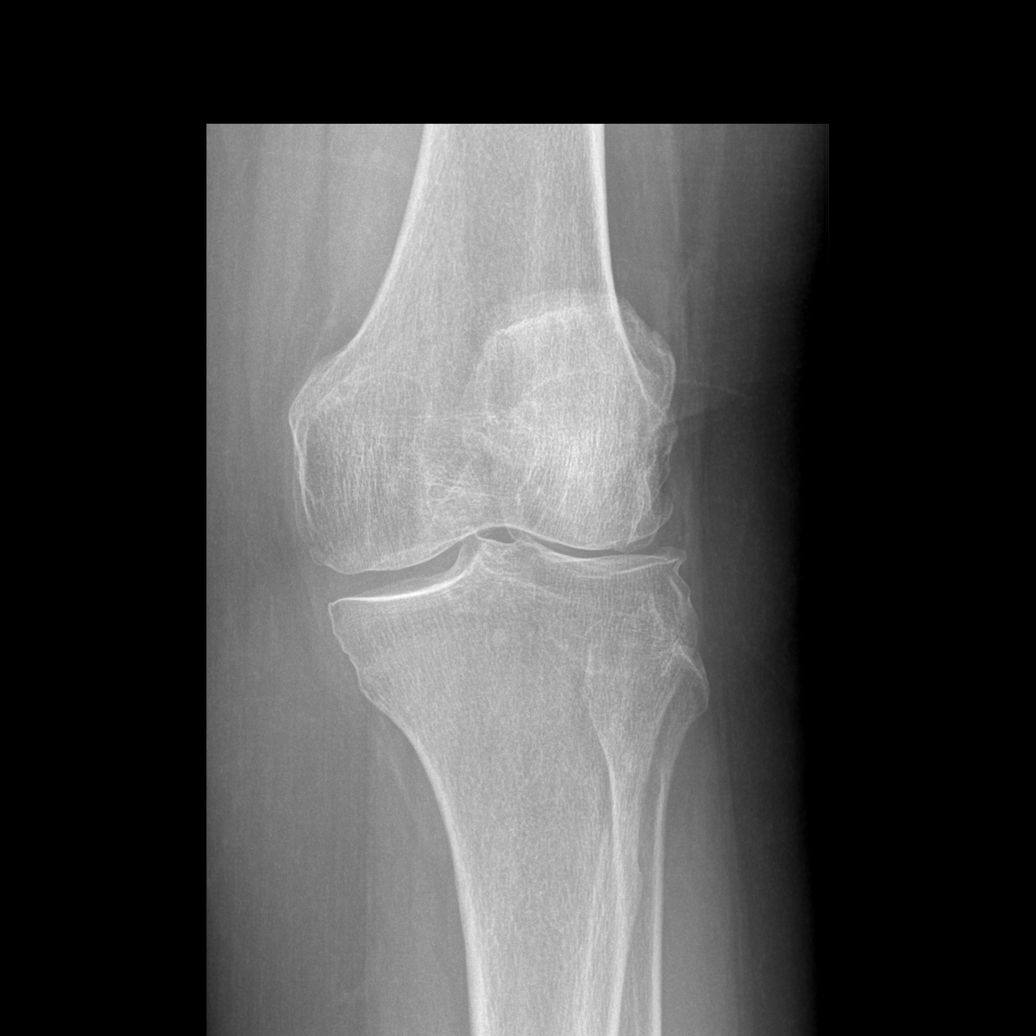

[t knee oblique left (1 of 2)]
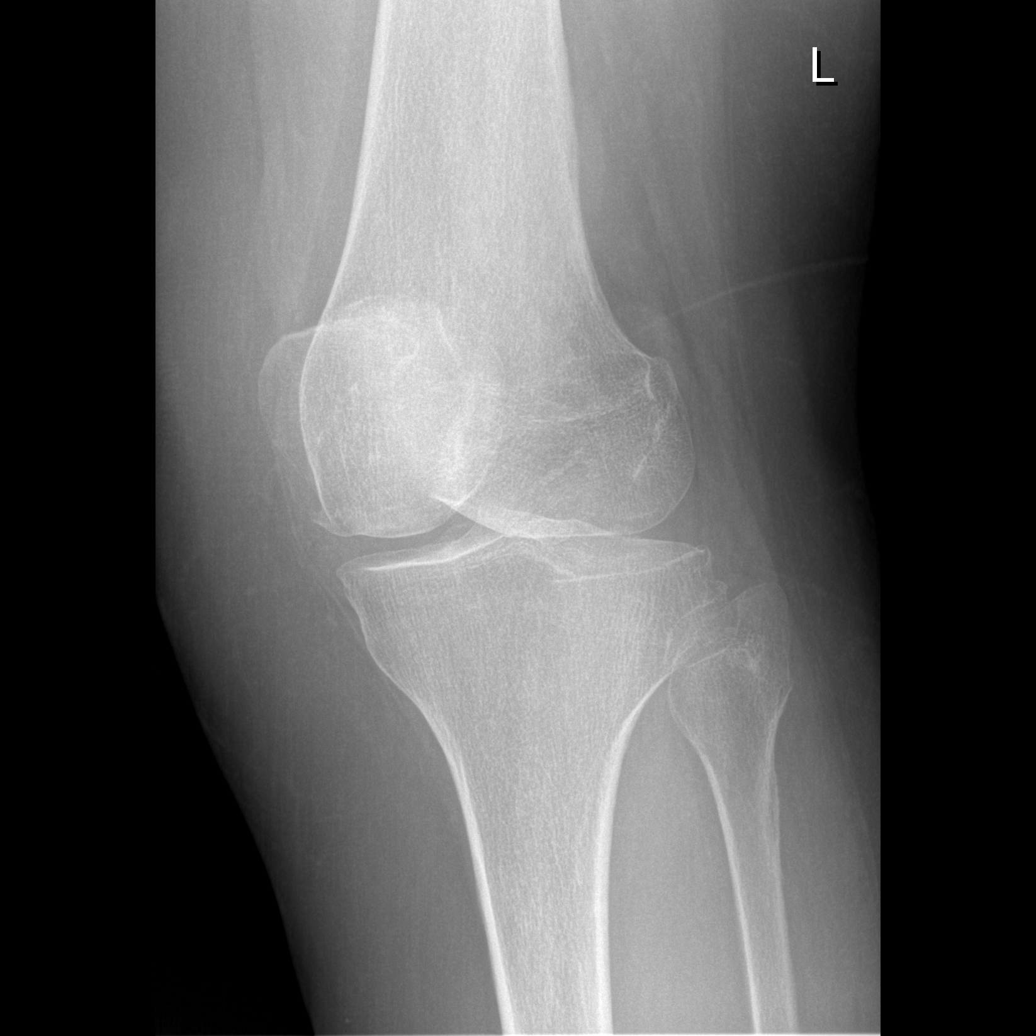

[t knee oblique left (2 of 2)]
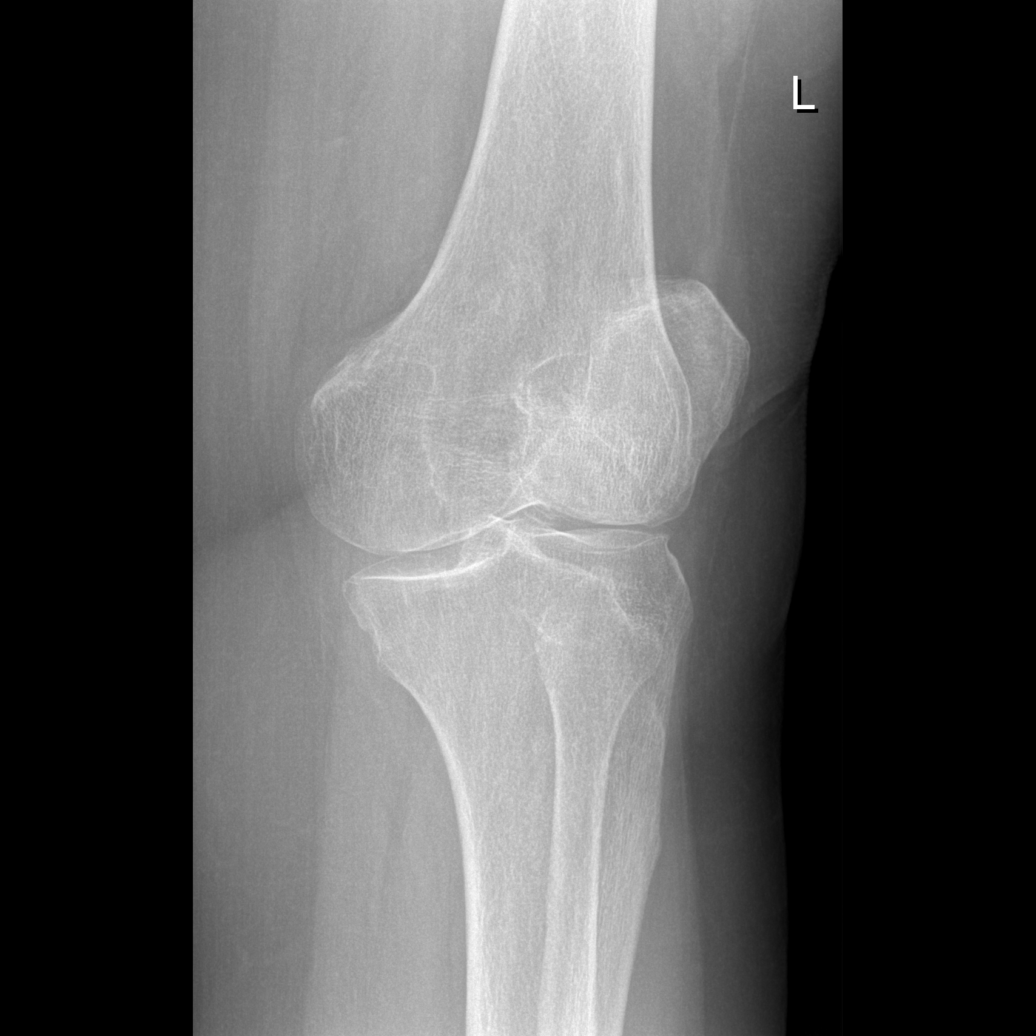

[t knee lat left]
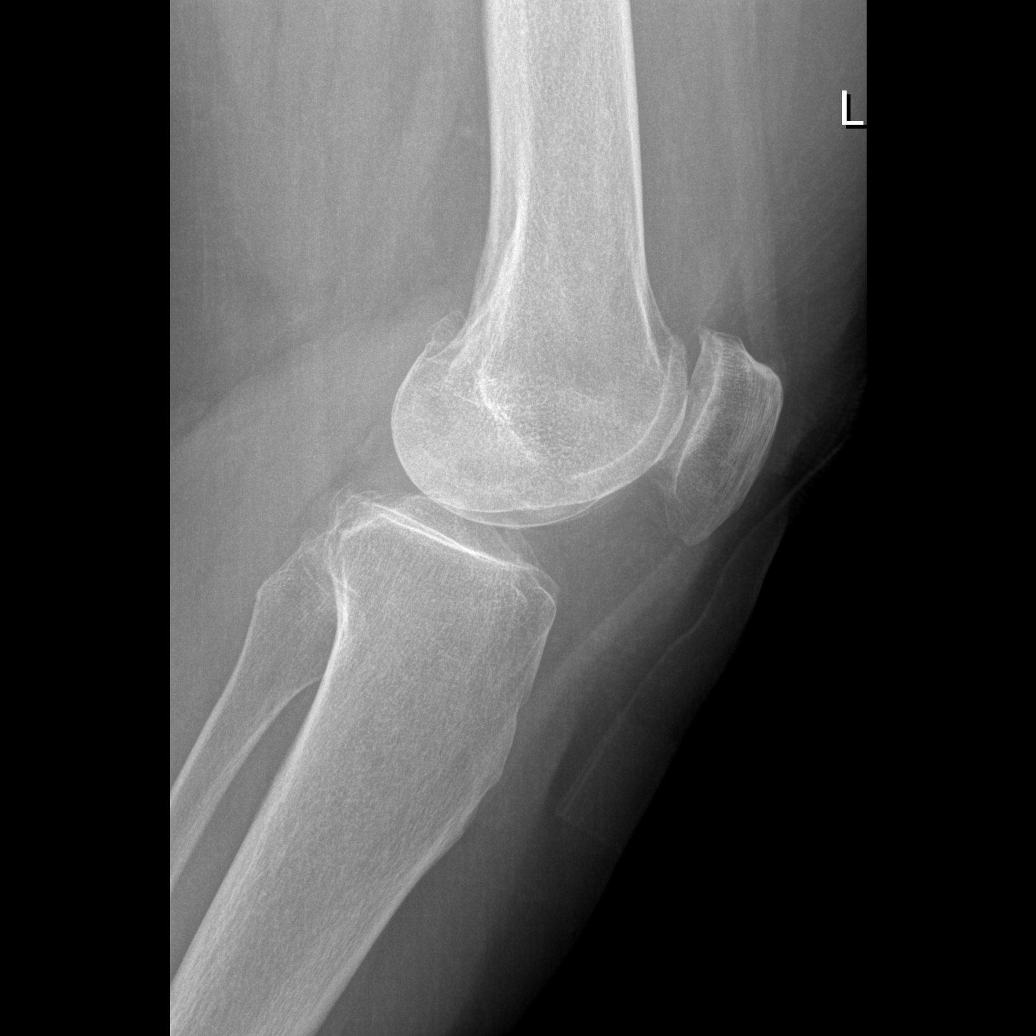

[4 of 4 positions shown; findings below may reference images not displayed]

FINDINGS: Bones are osteopenic.  The tibial spine spurring noted.
Moderate tricompartmental degenerative change.  Small suprapatellar
effusion.  No fracture or dislocation.
IMPRESSION: No acute bony finding, allowing for osteopenia.  Moderate
degenerative change.

## 2012-12-04 ENCOUNTER — Other Ambulatory Visit: Payer: Self-pay | Admitting: Family Medicine

## 2012-12-06 NOTE — Telephone Encounter (Signed)
Sent.  Please schedule CPE when feasible to discuss this med and the long term plan. Thanks.

## 2012-12-07 NOTE — Telephone Encounter (Signed)
I think that makes sense and I wish all of them only the best. I was always glad to see her.

## 2012-12-07 NOTE — Telephone Encounter (Signed)
Patient says they are in the process of slowly moving to [place] to be near their kids because of their delcining health.  She says she has seen an MD there that will probably take over her care and she will have her records transferred from here in the near future.  She says she really is sad about leaving the practice here but that it makes more sense to have a MD nearby her place of residence.

## 2012-12-25 ENCOUNTER — Other Ambulatory Visit: Payer: Self-pay | Admitting: Family Medicine

## 2013-03-25 ENCOUNTER — Other Ambulatory Visit: Payer: Self-pay | Admitting: Family Medicine

## 2013-03-25 NOTE — Telephone Encounter (Signed)
I thought he was getting set up in [place] with another clinic.  Let me know.  Thanks.  I didn't refill this.

## 2013-03-25 NOTE — Telephone Encounter (Signed)
Electronic refill request, last appt pt had with you was 01/23/12, and no future appt scheduled, please advise

## 2013-03-26 NOTE — Telephone Encounter (Deleted)
Patient has not been seen in greater than 1 year with no upcoming appt scheduled.  According to refill protocol, please advise.

## 2015-03-01 ENCOUNTER — Emergency Department (HOSPITAL_COMMUNITY)
Admission: EM | Admit: 2015-03-01 | Discharge: 2015-03-01 | Disposition: A | Discharge status: Home / Self Care | Payer: Medicare Other | Attending: Physician Assistant | Admitting: Physician Assistant

## 2015-03-01 ENCOUNTER — Encounter (HOSPITAL_COMMUNITY): Payer: Self-pay | Admitting: Emergency Medicine

## 2015-03-01 ENCOUNTER — Emergency Department (HOSPITAL_COMMUNITY): Payer: Medicare Other

## 2015-03-01 DIAGNOSIS — E669 Obesity, unspecified: Secondary | ICD-10-CM | POA: Diagnosis not present

## 2015-03-01 DIAGNOSIS — J449 Chronic obstructive pulmonary disease, unspecified: Secondary | ICD-10-CM | POA: Diagnosis not present

## 2015-03-01 DIAGNOSIS — K5900 Constipation, unspecified: Secondary | ICD-10-CM | POA: Diagnosis not present

## 2015-03-01 DIAGNOSIS — E039 Hypothyroidism, unspecified: Secondary | ICD-10-CM | POA: Insufficient documentation

## 2015-03-01 DIAGNOSIS — J45909 Unspecified asthma, uncomplicated: Secondary | ICD-10-CM | POA: Insufficient documentation

## 2015-03-01 DIAGNOSIS — Z79899 Other long term (current) drug therapy: Secondary | ICD-10-CM | POA: Diagnosis not present

## 2015-03-01 DIAGNOSIS — Z86010 Personal history of colonic polyps: Secondary | ICD-10-CM | POA: Insufficient documentation

## 2015-03-01 DIAGNOSIS — R1011 Right upper quadrant pain: Secondary | ICD-10-CM | POA: Diagnosis present

## 2015-03-01 DIAGNOSIS — F329 Major depressive disorder, single episode, unspecified: Secondary | ICD-10-CM | POA: Diagnosis not present

## 2015-03-01 LAB — CBC
HCT: 42.3 % (ref 36.0–46.0)
Hemoglobin: 14 g/dL (ref 12.0–15.0)
MCH: 31 pg (ref 26.0–34.0)
MCHC: 33.1 g/dL (ref 30.0–36.0)
MCV: 93.6 fL (ref 78.0–100.0)
PLATELETS: 242 10*3/uL (ref 150–400)
RBC: 4.52 MIL/uL (ref 3.87–5.11)
RDW: 15.3 % (ref 11.5–15.5)
WBC: 8.5 10*3/uL (ref 4.0–10.5)

## 2015-03-01 LAB — POC OCCULT BLOOD, ED: FECAL OCCULT BLD: NEGATIVE

## 2015-03-01 LAB — COMPREHENSIVE METABOLIC PANEL
ALBUMIN: 3.3 g/dL — AB (ref 3.5–5.0)
ALT: 19 U/L (ref 14–54)
AST: 22 U/L (ref 15–41)
Alkaline Phosphatase: 78 U/L (ref 38–126)
Anion gap: 9 (ref 5–15)
BUN: 10 mg/dL (ref 6–20)
CALCIUM: 8.9 mg/dL (ref 8.9–10.3)
CO2: 22 mmol/L (ref 22–32)
CREATININE: 0.86 mg/dL (ref 0.44–1.00)
Chloride: 106 mmol/L (ref 101–111)
GFR calc Af Amer: >60 mL/min (ref >60)
GFR calc non Af Amer: >60 mL/min (ref >60)
Glucose, Bld: 113 mg/dL — ABNORMAL HIGH (ref 65–99)
Potassium: 4.2 mmol/L (ref 3.5–5.1)
SODIUM: 137 mmol/L (ref 135–145)
Total Bilirubin: 0.7 mg/dL (ref 0.3–1.2)
Total Protein: 6.6 g/dL (ref 6.5–8.1)

## 2015-03-01 LAB — TYPE AND SCREEN
ABO/RH(D): A POS
Antibody Screen: NEGATIVE

## 2015-03-01 LAB — ABO/RH: ABO/RH(D): A POS

## 2015-03-01 LAB — LIPASE, BLOOD: LIPASE: 33 U/L (ref 22–51)

## 2015-03-01 NOTE — ED Notes (Signed)
To x-ray

## 2015-03-01 NOTE — Discharge Instructions (Signed)
Constipation, Adult Constipation is when a person has fewer than three bowel movements a week, has difficulty having a bowel movement, or has stools that are dry, hard, or larger than normal. As people grow older, constipation is more common. A low-fiber diet, not taking in enough fluids, and taking certain medicines may make constipation worse.  CAUSES   Certain medicines, such as antidepressants, pain medicine, iron supplements, antacids, and water pills.   Certain diseases, such as diabetes, irritable bowel syndrome (IBS), thyroid disease, or depression.   Not drinking enough water.   Not eating enough fiber-rich foods.   Stress or travel.   Lack of physical activity or exercise.   Ignoring the urge to have a bowel movement.   Using laxatives too much.  SIGNS AND SYMPTOMS   Having fewer than three bowel movements a week.   Straining to have a bowel movement.   Having stools that are hard, dry, or larger than normal.   Feeling full or bloated.   Pain in the lower abdomen.   Not feeling relief after having a bowel movement.  DIAGNOSIS  Your health care provider will take a medical history and perform a physical exam. Further testing may be done for severe constipation. Some tests may include:  A barium enema X-ray to examine your rectum, colon, and, sometimes, your small intestine.   A sigmoidoscopy to examine your lower colon.   A colonoscopy to examine your entire colon. TREATMENT  Treatment will depend on the severity of your constipation and what is causing it. Some dietary treatments include drinking more fluids and eating more fiber-rich foods. Lifestyle treatments may include regular exercise. If these diet and lifestyle recommendations do not help, your health care provider may recommend taking over-the-counter laxative medicines to help you have bowel movements. Prescription medicines may be prescribed if over-the-counter medicines do not work.    HOME CARE INSTRUCTIONS   Eat foods that have a lot of fiber, such as fruits, vegetables, whole grains, and beans.  Limit foods high in fat and processed sugars, such as french fries, hamburgers, cookies, candies, and soda.   A fiber supplement may be added to your diet if you cannot get enough fiber from foods.   Drink enough fluids to keep your urine clear or pale yellow.   Exercise regularly or as directed by your health care provider.   Go to the restroom when you have the urge to go. Do not hold it.   Only take over-the-counter or prescription medicines as directed by your health care provider. Do not take other medicines for constipation without talking to your health care provider first.  [place] IF:   You have bright red blood in your stool.   Your constipation lasts for more than 4 days or gets worse.   You have abdominal or rectal pain.   You have thin, pencil-like stools.   You have unexplained weight loss. MAKE SURE YOU:   Understand these instructions.  Will watch your condition.  Will get help right away if you are not doing well or get worse.   This information is not intended to replace advice given to you by your health care provider. Make sure you discuss any questions you have with your health care provider.   Document Released: 02/02/2004 Document Revised: 05/27/2014 Document Reviewed: 02/15/2013 Elsevier Interactive Patient Education 2016 [place] labs are all within normal parameters.  The x-ray shows that you do not have an obstruction or severe  constipation.  Your stool tested negative for blood.  I feel that is safe for you to travel home.  Try to drink more fluids and normalize your diet. Make an appointment with your primary care physician when you get home

## 2015-03-01 NOTE — ED Notes (Signed)
Pt visiting from [place].  She has had abd pain for 2 days.  No n v.  Some dark hard stools .  She is usually not constipated.  No distress

## 2015-03-01 NOTE — ED Notes (Signed)
The pt returned from xray  Comfortable  Family at the bedside

## 2015-03-01 NOTE — ED Notes (Signed)
The pt is comfortable.  She does not feel like she can have a bm right now.

## 2015-03-01 NOTE — ED Notes (Signed)
Onset 2 days ago RUQ and LUQ abdominal pain and constipated.  Took dulcolax states hard black stool then 3 episodes of loose watery black stool.  Pain currently 3/10 achy abdominal pain increases with deep inspiration RUQ and LUQ to 8/10 achy dull.

## 2015-03-01 NOTE — ED Provider Notes (Signed)
CSN: [id number]     Arrival date & time 03/01/15  1834 History   First MD Initiated Contact with Patient 03/01/15 1952     Chief Complaint  Patient presents with  . Abdominal Pain  . Melena  . Constipation     (Consider location/radiation/quality/duration/timing/severity/associated sxs/prior Treatment) HPI Comments: States Upper abdominal pain and constipation too a Dulcolax last night, had a hard formed BM followed by 3 loose stools and decrease in pain but still uncomfortable.   Hx of thyroid Dx recently had level drawn- low but no change in medication, depression, arthritis- recently had cortisone injections in bilateral hips and will have cortisone injection in lower back when she returns home to [place] next week, so has COPD with intermittent use of inhaler usually with season change Reports she use Ibuprofen last week despite being told not to due to gastric bipass surgery and last night did use Pepto Bismol  Patient is a 69 y.o. female presenting with abdominal pain and constipation. The history is provided by the patient.  Abdominal Pain Pain location:  LUQ and RUQ Pain quality: dull and fullness   Pain radiates to:  Does not radiate Pain severity:  Severe Onset quality:  Gradual Duration:  2 days Timing:  Constant Progression:  Improving Chronicity:  New Context: laxative use   Context: not previous surgeries, not recent illness, not retching, not sick contacts, not suspicious food intake and not trauma   Relieved by: laxative. Worsened by:  Eating Ineffective treatments: laxative. Associated symptoms: constipation and diarrhea   Associated symptoms: no chest pain, no chills, no cough, no fever, no flatus, no nausea, no shortness of breath and no vomiting   Risk factors: being elderly and obesity   Constipation Associated symptoms: abdominal pain, back pain and diarrhea   Associated symptoms: no fever, no flatus, no nausea and no vomiting     Past Medical History   Diagnosis Date  . Allergy   . Asthma     Mild Intermittent  . Depression   . Hypothyroidism     Hypothyroidism  . Back pain     with h/o injection per pain clinic  . History of colon polyps   . COPD (chronic obstructive pulmonary disease) ([place])   . H/O Clostridium difficile infection   . History of gallstones   . Obesity   . H/O ulcerative colitis     pt stated   Past Surgical History  Procedure Laterality Date  . Gastric bypass  2005    Lost 120 lbs  . C. difficile  Holiday  . Back surgery  2002    Disc and rod & rod in cervical spine  . Nasal septum surgery    . Cholecystectomy  1990  . Foot surgery      Bone spurs, right  . Total abdominal hysterectomy  1990  . Tonsillectomy    . Ct head limited w/cm  05/2003    Chronic sinusitis  . Pft's  1990 and 2005    Negative  . Cardiovascular stress test  1985    Normal   Family History  Problem Relation Age of Onset  . Alcohol abuse Mother   . Pancreatic cancer Mother   . Irritable bowel syndrome Mother   . Schizophrenia Father   . Breast cancer Paternal Aunt   . Alcohol abuse Maternal Uncle   . Stomach cancer Maternal Uncle   . Brain cancer Maternal Aunt    Social History  Substance  Use Topics  . Smoking status: Passive Smoke Exposure - Never Smoker  . Smokeless tobacco: Never Used     Comment: grew up around smokers-but patient has NEVER smoked  . Alcohol Use: 0.6 oz/week    1 Glasses of wine per week     Comment: ocass   OB History    No data available     Review of Systems  Constitutional: Negative for fever and chills.  Respiratory: Negative for cough, shortness of breath and wheezing.   Cardiovascular: Negative for chest pain.  Gastrointestinal: Positive for abdominal pain, diarrhea and constipation. Negative for nausea, vomiting, abdominal distention and flatus.  Musculoskeletal: Positive for back pain. Negative for myalgias.  Skin: Negative for rash and wound.  All other systems reviewed  and are negative.     Allergies  Cephalexin and Shellfish allergy  Home Medications   Prior to Admission medications   Medication Sig Start Date End Date Taking? Authorizing Provider  albuterol (PROVENTIL HFA;VENTOLIN HFA) 108 (90 BASE) MCG/ACT inhaler Inhale 2 puffs into the lungs every 6 (six) hours as needed. 01/31/12   Ria Bush, MD  Calcium Carb-Cholecalciferol (CALCIUM 500 +D) 500-400 MG-UNIT TABS Take 1 tablet by mouth daily.      Historical Provider, MD  cherry syrup syrup Take by mouth daily.    Historical Provider, MD  cyanocobalamin (,VITAMIN B-12,) 1000 MCG/ML injection Inject 1 mL (1,000 mcg total) into the muscle every 14 (fourteen) days. 04/10/12   Tonia Ghent, MD  estradiol (ESTRACE) 1 MG tablet TAKE 1 TABLET DAILY 12/04/12   Tonia Ghent, MD  fexofenadine [facility]) 30 MG/5ML suspension Take 60 mg by mouth daily.    Historical Provider, MD  FLUoxetine (PROZAC) 20 MG capsule Take three tablets by mouth daily * Please schedule an appointment with Dr for any additional refills* 12/25/12   Tonia Ghent, MD  Fluticasone-Salmeterol (ADVAIR) 250-50 MCG/DOSE AEPB Inhale 1 puff into the lungs every 12 (twelve) hours as needed.      Historical Provider, MD  levothyroxine (SYNTHROID, LEVOTHROID) 125 MCG tablet Take 1 tablet (125 mcg total) by mouth daily. 01/06/12   Tonia Ghent, MD  LORazepam (ATIVAN) 1 MG tablet Take 1-2 tablets (1-2 mg total) by mouth every 8 (eight) hours as needed for anxiety. 10/30/11   Tonia Ghent, MD  MULTIPLE VITAMIN PO Take by mouth.      Historical Provider, MD  NON FORMULARY Green super foods powder, daily    Historical Provider, MD   BP 136/65 mmHg  Pulse 62  Temp(Src) 97.4 F (36.3 C) (Oral)  Resp 18  Ht 5\' 3"  (1.6 m)  Wt 238 lb (107.956 kg)  BMI 42.17 kg/m2  SpO2 96% Physical Exam  Constitutional: She appears well-developed and well-nourished.  HENT:  Head: Normocephalic.  Eyes: Pupils are equal, round, and reactive to  light.  Neck: Normal range of motion.  Cardiovascular: Normal rate and regular rhythm.   Pulmonary/Chest: Effort normal and breath sounds normal.  Abdominal: Soft. Bowel sounds are normal. She exhibits no distension. There is tenderness in the right upper quadrant and left upper quadrant.  Very mild tenderness with deep palpation   Musculoskeletal: Normal range of motion.  Neurological: She is alert.  Skin: Skin is warm and dry.  Nursing note and vitals reviewed.   ED Course  Procedures (including critical care time) Labs Review Labs Reviewed  COMPREHENSIVE METABOLIC PANEL - Abnormal; Notable for the following:    Glucose, Bld 113 (*)  Albumin 3.3 (*)    All other components within normal limits  LIPASE, BLOOD  CBC  URINALYSIS, ROUTINE W REFLEX MICROSCOPIC (NOT AT [facility])  POC OCCULT BLOOD, ED  POC OCCULT BLOOD, ED  TYPE AND SCREEN  ABO/RH    Imaging Review Dg Abd Acute W/chest  03/01/2015  CLINICAL DATA:  69 year old female with epigastric pain radiating to the back for 3 days. Dark stool today and diarrhea. Initial encounter. EXAM: DG ABDOMEN ACUTE W/ 1V CHEST COMPARISON:  CT Abdomen and Pelvis 06/02/2008. FINDINGS: Stable mild elevation of the right hemidiaphragm. Lung volumes within normal limits. Normal cardiac size and mediastinal contours. No pneumothorax or pneumoperitoneum. No confluent pulmonary opacity. Cervical ACDF. Stable cholecystectomy clips. Staple line in the left abdomen corresponds to a small bowel anastomosis seen in 2010. Staple line in the left upper quadrant corresponding the gastric bypass, also present on the 2010 study. Non obstructed bowel gas pattern. Abdominal and pelvic visceral contours are within normal limits. Pelvic phleboliths. No acute osseous abnormality identified. IMPRESSION: 1.  Normal bowel gas pattern, no free air. 2.  No acute cardiopulmonary abnormality. Electronically Signed   By: Genevie Ann M.D.   On: 03/01/2015 22:57   I have personally  reviewed and evaluated these images and lab results as part of my medical decision-making.   EKG Interpretation None     Patient was evaluated for upper abdominal pain and presumed blood in her stool after taking Pepto-Bismol laxative.  Her guaiac was negative.  Her lab work is all within normal parameters, her acute abdomen shows that she has no obstruction or severe constipation.   MDM   Final diagnoses:  Constipation, unspecified constipation type         Junius Creamer, NP 03/01/15 [id number]  Courteney Lyn Mackuen, MD 03/01/15 [id number]

## 2015-03-01 NOTE — ED Notes (Signed)
Patient left at this time with all belongings.
